# Patient Record
Sex: Male | Born: 1970 | Race: White | Hispanic: No | Marital: Married | Smoking: Current every day smoker
Health system: Southern US, Community
[De-identification: ages and names within clinical notes are randomized; demographics above are authoritative.]

---

## 1998-04-16 ENCOUNTER — Emergency Department (HOSPITAL_COMMUNITY): Admission: EM | Admit: 1998-04-16 | Discharge: 1998-04-16 | Payer: Self-pay | Admitting: Emergency Medicine

## 2002-04-05 ENCOUNTER — Emergency Department (HOSPITAL_COMMUNITY): Admission: EM | Admit: 2002-04-05 | Discharge: 2002-04-05 | Payer: Self-pay | Admitting: Emergency Medicine

## 2002-04-06 ENCOUNTER — Encounter: Payer: Self-pay | Admitting: Family Medicine

## 2002-04-06 ENCOUNTER — Ambulatory Visit (HOSPITAL_COMMUNITY): Admission: RE | Admit: 2002-04-06 | Discharge: 2002-04-06 | Payer: Self-pay | Admitting: Family Medicine

## 2008-03-21 ENCOUNTER — Emergency Department (HOSPITAL_COMMUNITY): Admission: EM | Admit: 2008-03-21 | Discharge: 2008-03-21 | Payer: Self-pay | Admitting: Emergency Medicine

## 2009-11-29 ENCOUNTER — Emergency Department (HOSPITAL_COMMUNITY): Admission: EM | Admit: 2009-11-29 | Discharge: 2009-11-29 | Payer: Self-pay | Admitting: Emergency Medicine

## 2010-04-02 ENCOUNTER — Emergency Department (HOSPITAL_COMMUNITY): Admission: EM | Admit: 2010-04-02 | Discharge: 2010-04-02 | Payer: Self-pay | Admitting: Emergency Medicine

## 2011-01-19 LAB — COMPREHENSIVE METABOLIC PANEL
ALT: 16 U/L (ref 0–53)
AST: 28 U/L (ref 0–37)
Albumin: 4.3 g/dL (ref 3.5–5.2)
Alkaline Phosphatase: 51 U/L (ref 39–117)
BUN: 9 mg/dL (ref 6–23)
CO2: 26 mEq/L (ref 19–32)
Calcium: 9.2 mg/dL (ref 8.4–10.5)
Chloride: 105 mEq/L (ref 96–112)
Creatinine, Ser: 1.08 mg/dL (ref 0.4–1.5)
GFR calc Af Amer: >60 mL/min (ref >60)
GFR calc non Af Amer: >60 mL/min (ref >60)
Glucose, Bld: 126 mg/dL — ABNORMAL HIGH (ref 70–99)
Potassium: 3.9 mEq/L (ref 3.5–5.1)
Sodium: 138 mEq/L (ref 135–145)
Total Bilirubin: 1.2 mg/dL (ref 0.3–1.2)
Total Protein: 7.1 g/dL (ref 6.0–8.3)

## 2011-01-19 LAB — DIFFERENTIAL
Basophils Absolute: 0 10*3/uL (ref 0.0–0.1)
Basophils Relative: 0 % (ref 0–1)
Eosinophils Absolute: 0 10*3/uL (ref 0.0–0.7)
Eosinophils Relative: 0 % (ref 0–5)
Lymphocytes Relative: 13 % (ref 12–46)
Lymphs Abs: 1.7 10*3/uL (ref 0.7–4.0)
Monocytes Absolute: 0.6 10*3/uL (ref 0.1–1.0)
Monocytes Relative: 5 % (ref 3–12)
Neutro Abs: 10.8 10*3/uL — ABNORMAL HIGH (ref 1.7–7.7)
Neutrophils Relative %: 82 % — ABNORMAL HIGH (ref 43–77)

## 2011-01-19 LAB — POCT CARDIAC MARKERS
CKMB, poc: 5.2 ng/mL (ref 1.0–8.0)
Myoglobin, poc: 213 ng/mL (ref 12–200)
Troponin i, poc: <0.05 ng/mL (ref 0.00–0.09)

## 2011-01-19 LAB — CBC
HCT: 46.6 % (ref 39.0–52.0)
Hemoglobin: 16 g/dL (ref 13.0–17.0)
MCHC: 34.3 g/dL (ref 30.0–36.0)
MCV: 94.6 fL (ref 78.0–100.0)
Platelets: 299 10*3/uL (ref 150–400)
RBC: 4.93 MIL/uL (ref 4.22–5.81)
RDW: 13.1 % (ref 11.5–15.5)
WBC: 13.2 10*3/uL — ABNORMAL HIGH (ref 4.0–10.5)

## 2011-01-19 LAB — LIPASE, BLOOD: Lipase: 24 U/L (ref 11–59)

## 2011-07-29 LAB — DIFFERENTIAL
Eosinophils Relative: 2
Lymphocytes Relative: 14
Monocytes Absolute: 0.2
Monocytes Relative: 3
Neutro Abs: 6.8

## 2011-07-29 LAB — CBC
HCT: 50.3
Hemoglobin: 17.5 — ABNORMAL HIGH
MCHC: 34.7
MCV: 92.2
Platelets: 298
RBC: 5.45
RDW: 13.6
WBC: 8.4

## 2011-07-29 LAB — COMPREHENSIVE METABOLIC PANEL
AST: 21
Albumin: 4.2
Alkaline Phosphatase: 59
BUN: 6
Chloride: 102
Creatinine, Ser: 1.05
GFR calc Af Amer: >60
Potassium: 4.5
Total Protein: 7.3

## 2011-07-29 LAB — LIPASE, BLOOD: Lipase: 20

## 2013-11-18 ENCOUNTER — Emergency Department (HOSPITAL_COMMUNITY)
Admission: EM | Admit: 2013-11-18 | Discharge: 2013-11-18 | Disposition: A | Discharge status: Home / Self Care | Payer: Self-pay | Attending: Emergency Medicine | Admitting: Emergency Medicine

## 2013-11-18 ENCOUNTER — Encounter (HOSPITAL_COMMUNITY): Payer: Self-pay | Admitting: Emergency Medicine

## 2013-11-18 DIAGNOSIS — T7411XA Adult physical abuse, confirmed, initial encounter: Secondary | ICD-10-CM | POA: Insufficient documentation

## 2013-11-18 DIAGNOSIS — F17200 Nicotine dependence, unspecified, uncomplicated: Secondary | ICD-10-CM | POA: Insufficient documentation

## 2013-11-18 DIAGNOSIS — S0101XA Laceration without foreign body of scalp, initial encounter: Secondary | ICD-10-CM

## 2013-11-18 DIAGNOSIS — Z23 Encounter for immunization: Secondary | ICD-10-CM | POA: Insufficient documentation

## 2013-11-18 DIAGNOSIS — S0100XA Unspecified open wound of scalp, initial encounter: Secondary | ICD-10-CM | POA: Insufficient documentation

## 2013-11-18 MED ORDER — TETANUS-DIPHTH-ACELL PERTUSSIS 5-2.5-18.5 LF-MCG/0.5 IM SUSP
0.5000 mL | Freq: Once | INTRAMUSCULAR | Status: AC
  Administered 2013-11-18: 0.5 mL via INTRAMUSCULAR
  Filled 2013-11-18: qty 0.5

## 2013-11-18 NOTE — Discharge Instructions (Signed)
Return for vomiting, passing out, confusion or any new concerns Keep wound clean, shower as normal.  Staples removed in 10-14 days.  If you were given medicines take as directed.  If you are on coumadin or contraceptives realize their levels and effectiveness is altered by many different medicines.  If you have any reaction (rash, tongues swelling, other) to the medicines stop taking and see a physician.   Please follow up as directed and return to the ER or see a physician for new or worsening symptoms.  Thank you.  Head Injury, Adult You have received a head injury. It does not appear serious at this time. Headaches and vomiting are common following head injury. It should be easy to awaken from sleeping. Sometimes it is necessary for you to stay in the emergency department for a while for observation. Sometimes admission to the hospital may be needed. After injuries such as yours, most problems occur within the first 24 hours, but side effects may occur up to 7 10 days after the injury. It is important for you to carefully monitor your condition and contact your health care provider or seek immediate medical care if there is a change in your condition. WHAT ARE THE TYPES OF HEAD INJURIES? Head injuries can be as minor as a bump. Some head injuries can be more severe. More severe head injuries include:  A jarring injury to the brain (concussion).  A bruise of the brain (contusion). This mean there is bleeding in the brain that can cause swelling.  A cracked skull (skull fracture).  Bleeding in the brain that collects, clots, and forms a bump (hematoma). WHAT CAUSES A HEAD INJURY? A serious head injury is most likely to happen to someone who is in a car wreck and is not wearing a seat belt. Other causes of major head injuries include bicycle or motorcycle accidents, sports injuries, and falls. HOW ARE HEAD INJURIES DIAGNOSED? A complete history of the event leading to the injury and your  current symptoms will be helpful in diagnosing head injuries. Many times, pictures of the brain, such as CT or MRI are needed to see the extent of the injury. Often, an overnight hospital stay is necessary for observation.  WHEN SHOULD I SEEK IMMEDIATE MEDICAL CARE?  You should get help right away if:  You have confusion or drowsiness.  You feel sick to your stomach (nauseous) or have continued, forceful vomiting.  You have dizziness or unsteadiness that is getting worse.  You have severe, continued headaches not relieved by medicine. Only take over-the-counter or prescription medicines for pain, fever, or discomfort as directed by your health care provider.  You do not have normal function of the arms or legs or are unable to walk.  You notice changes in the black spots in the center of the colored part of your eye (pupil).  You have a clear or bloody fluid coming from your nose or ears.  You have a loss of vision. During the next 24 hours after the injury, you must stay with someone who can watch you for the warning signs. This person should contact local emergency services (911 in the [place] if you have seizures, you become unconscious, or you are unable to wake up. HOW CAN I PREVENT A HEAD INJURY IN THE FUTURE? The most important factor for preventing major head injuries is avoiding motor vehicle accidents. To minimize the potential for damage to your head, it is crucial to wear seat belts while riding in  motor vehicles. Wearing helmets while bike riding and playing collision sports (like football) is also helpful. Also, avoiding dangerous activities around the house will further help reduce your risk of head injury.  WHEN CAN I RETURN TO NORMAL ACTIVITIES AND ATHLETICS? You should be reevaluated by your health care provider before returning to these activities. If you have any of the following symptoms, you should not return to activities or contact sports until 1 week after the symptoms  have stopped:  Persistent headache.  Dizziness or vertigo.  Poor attention and concentration.  Confusion.  Memory problems.  Nausea or vomiting.  Fatigue or tire easily.  Irritability.  Intolerant of bright lights or loud noises.  Anxiety or depression.  Disturbed sleep. MAKE SURE YOU:   Understand these instructions.  Will watch your condition.  Will get help right away if you are not doing well or get worse. Document Released: 10/19/2005 Document Revised: 08/09/2013 Document Reviewed: 06/26/2013 [facility] Patient Information 2014 [place], [place].
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
## 2014-05-03 ENCOUNTER — Emergency Department (HOSPITAL_COMMUNITY)
Admission: EM | Admit: 2014-05-03 | Discharge: 2014-05-03 | Disposition: A | Discharge status: Home / Self Care | Payer: Worker's Compensation | Attending: Emergency Medicine | Admitting: Emergency Medicine

## 2014-05-03 ENCOUNTER — Encounter (HOSPITAL_COMMUNITY): Payer: Self-pay | Admitting: Emergency Medicine

## 2014-05-03 DIAGNOSIS — S99919A Unspecified injury of unspecified ankle, initial encounter: Principal | ICD-10-CM

## 2014-05-03 DIAGNOSIS — Y9289 Other specified places as the place of occurrence of the external cause: Secondary | ICD-10-CM | POA: Diagnosis not present

## 2014-05-03 DIAGNOSIS — F17200 Nicotine dependence, unspecified, uncomplicated: Secondary | ICD-10-CM | POA: Insufficient documentation

## 2014-05-03 DIAGNOSIS — S99929A Unspecified injury of unspecified foot, initial encounter: Principal | ICD-10-CM

## 2014-05-03 DIAGNOSIS — S8990XA Unspecified injury of unspecified lower leg, initial encounter: Secondary | ICD-10-CM | POA: Insufficient documentation

## 2014-05-03 DIAGNOSIS — X500XXA Overexertion from strenuous movement or load, initial encounter: Secondary | ICD-10-CM | POA: Diagnosis not present

## 2014-05-03 DIAGNOSIS — M25561 Pain in right knee: Secondary | ICD-10-CM

## 2014-05-03 DIAGNOSIS — Y990 Civilian activity done for income or pay: Secondary | ICD-10-CM | POA: Diagnosis not present

## 2014-05-03 MED ORDER — NAPROXEN 500 MG PO TABS: 500.0000 mg | ORAL_TABLET | Freq: Two times a day (BID) | ORAL | Status: DC

## 2014-05-03 MED ORDER — HYDROCODONE-ACETAMINOPHEN 5-325 MG PO TABS: 1.0000 | ORAL_TABLET | Freq: Four times a day (QID) | ORAL | Status: DC | PRN

## 2014-05-03 NOTE — Progress Notes (Signed)
[facility] provided pt with a list of primary care resources to help patient establish a pcp.  °

## 2014-05-03 NOTE — Progress Notes (Signed)
Orthopedic Tech Progress Note Patient Details:  Gilda CreaseHarold L Brissett 10/17/1971 [id number] Applied knee immobilizer to RLE. Ortho Devices Type of Ortho Device: Knee Immobilizer Ortho Device/Splint Location: RLE Ortho Device/Splint Interventions: Application   Lesle ChrisGilliland, Nelsy Madonna L 05/03/2014, 12:57 PM

## 2014-05-03 NOTE — ED Provider Notes (Signed)
Medical screening examination/treatment/procedure(s) were performed by non-physician practitioner and as supervising physician I was immediately available for consultation/collaboration.   EKG Interpretation None        Gwyneth SproutWhitney Donna Silverman, MD 05/03/14 1102

## 2014-05-03 NOTE — ED Provider Notes (Signed)
CSN: [id number]     Arrival date & time 05/03/14  1002 History   First MD Initiated Contact with Patient 05/03/14 1006     No chief complaint on file.    (Consider location/radiation/quality/duration/timing/severity/associated sxs/prior Treatment) HPI Comments: Patient presents today with pain of his right knee.  He reports that the pain has been present since yesterday.  Pain worse along the medial aspect of the knee.  He states that yesterday while at work he was on the ground working on a car.  When he stood up he felt and heard a pop in the right knee.  He reports that he has been having constant pain and swelling of the knee since that time.  Pain worse with ambulating.  He reports that last evening he took Ibuprofen and applied ice without relief.  He denies any prior injury to the knee.  He denies erythema or warmth of the knee.  No fever or chills.  No numbness or tingling.    The history is provided by the patient.    No past medical history on file. No past surgical history on file. No family history on file. History  Substance Use Topics  . Smoking status: Current Every Day Smoker  . Smokeless tobacco: Never Used  . Alcohol Use: Yes     Comment: occassionally    Review of Systems  Constitutional: Negative for fever and chills.  Musculoskeletal:       Right knee pain and swelling  Skin: Negative for color change.  Neurological: Negative for numbness.      Allergies  Review of patient's allergies indicates no known allergies.  Home Medications   Prior to Admission medications   Not on File   There were no vitals taken for this visit. Physical Exam  Nursing note and vitals reviewed. Constitutional: He appears well-developed and well-nourished.  HENT:  Head: Normocephalic and atraumatic.  Mouth/Throat: Oropharynx is clear and moist.  Neck: Normal range of motion. Neck supple.  Cardiovascular: Normal rate, regular rhythm and normal heart sounds.   Pulses:  Dorsalis pedis pulses are 2+ on the right side, and 2+ on the left side.  Pulmonary/Chest: Effort normal and breath sounds normal.  Musculoskeletal: Normal range of motion.       Right knee: He exhibits no deformity, no erythema, no LCL laxity and no MCL laxity. Tenderness found. Medial joint line tenderness noted.  Mild swelling of the medial aspect of the right knee No obvious laxity with Anterior and Posterior drawer Increased pain with flexion of the knee and valgus stress of the knee    Neurological: He is alert.  Distal sensation of the right foot intact  Skin: Skin is warm and dry.  No erythema or warmth of the right knee No bruising of the right knee  Psychiatric: He has a normal mood and affect.    ED Course  Procedures (including critical care time) Labs Review Labs Reviewed - No data to display  Imaging Review No results found.   EKG Interpretation None      MDM   Final diagnoses:  None   Patient presenting with right knee pain.  He reports feeling and hearing a "pop" in the knee when standing up yesterday at work. No signs of infection on exam.  Due to the mechanism of injury do not feel that an xray is indicated.  No laxity of the ligaments.  Patient given knee immobilizer, pain medication, Naproxen.  RICE instructions also given.  Patient  given referral to Orthopedics.  Patient stable for discharge.  Return precautions given.      Santiago GladHeather Harlyn Rathmann, PA-C 05/03/14 1053

## 2014-05-03 NOTE — Discharge Instructions (Signed)
Be sure to read and understand instructions below prior to leaving the hospital. If your symptoms persist without any improvement in 1 week it is reccommended that you follow up with orthopedics listed above. Use your pain medication as prescribed and do not operate heavy machinery while on pain medication. Note that your pain medication contains acetaminophen (Tylenol) & its is not recommended that you use additional acetaminophen (Tylenol) while taking this medication. ° °Knee Effusion  °The medical term for having fluid in your knee is effusion.This means something is wrong inside the knee. Some of the causes of fluid in the knee may be torn cartilage, a torn ligament, or bleeding into the joint from an injury. Small tears may heal on their own with conservative treatment. Conservative means rest, limited weight bearing activity and muscle strengthening exercises. Your recovery may take up to 6 weeks. Larger tears may require surgery.  ° °TREATMENT  °Rest, ice, elevation, and compression are the basic modes of treatment.   °Apply ice to the sore area for 15 to 20 minutes, 3 to 4 times per day. Do this while you are awake for the first 2 days, or as directed. This can be stopped when the swelling goes away. Put the ice in a plastic bag and place a towel between the bag of ice and your skin.  °Keep your leg elevated when possible to lessen swelling.  °If your caregiver recommends crutches, use them as instructed for 1 week. Then, you may walk as tolerated.  °Do not drive a vehicle on pain medication. °ACTIVITY: °           - Weight bearing as tolerated °           - Exercises should be limited to pain free range of motion ° °Knee Immobilization:: This is used to support and protect an injured or painful knee. Knee immobilizers keep your knee from being used while it is healing.  °Use powder to control irritation from sweat and friction.  °Adjust the immobilizer to be firm but not tight. Signs of an immobilizer that  is too tight include:  ° Swelling.  ° Numbness.  ° Color change in your foot or ankle.  ° Increased pain.  °While resting, raise your leg above the level of your heart. This reduces throbbing and helps healing. Prop it up with pillows.  °Remove the immobilizer to bathe and sleep. Wear it other times until you see your doctor again.  °             °SEEK MEDICAL CARE IF:  °You have an increase in bruising, swelling, or pain.  °Your toes feel cold.  °Pain relief is not achieved with medications.  °EMERGENCY:: Your toes are numb or blue or you have severe pain.  °You notice redness, swelling, warmth or increasing pain in your knee.  °An unexplained oral temperature above 102° F (38.9° C) develops. ° °COLD THERAPY DIRECTIONS:  °Ice or gel packs can be used to reduce both pain and swelling. Ice is the most helpful within the first 24 to 48 hours after an injury or flareup from overusing a muscle or joint.  Ice is effective, has very few side effects, and is safe for most people to use.  ° °If you expose your skin to cold temperatures for too long or without the proper protection, you can damage your skin or nerves. Watch for signs of skin damage due to cold.  ° °HOME CARE INSTRUCTIONS  °Follow these   tips to use ice and cold packs safely.  °Place a dry or damp towel between the ice and skin. A damp towel will cool the skin more quickly, so you may need to shorten the time that the ice is used.  °For a more rapid response, add gentle compression to the ice.  °Ice for no more than 10 to 20 minutes at a time. The bonier the area you are icing, the less time it will take to get the benefits of ice.  °Check your skin after 5 minutes to make sure there are no signs of a poor response to cold or skin damage.  °Rest 20 minutes or more in between uses.  °Once your skin is numb, you can end your treatment. You can test numbness by very lightly touching your skin. The touch should be so light that you do not see the skin dimple from  the pressure of your fingertip. When using ice, most people will feel these normal sensations in this order: cold, burning, aching, and numbness.  °Do not use ice on someone who cannot communicate their responses to pain, such as small children or people with dementia.  ° °HOW TO MAKE AN ICE PACK  °To make an ice pack, do one of the following:  °Place crushed ice or a bag of frozen vegetables in a sealable plastic bag. Squeeze out the excess air. Place this bag inside another plastic bag. Slide the bag into a pillowcase or place a damp towel between your skin and the bag.  °Mix 3 parts water with 1 part rubbing alcohol. Freeze the mixture in a sealable plastic bag. When you remove the mixture from the freezer, it will be slushy. Squeeze out the excess air. Place this bag inside another plastic bag. Slide the bag into a pillowcase or place a damp towel between your s ° ° ° ° ° °

## 2014-05-03 NOTE — ED Notes (Signed)
C/o r/knee pain and swelling. Pt stated that he stood quickly while at work yesterday and fell a popping sensation in r/knee. C/o r/knee swelling and pain, treated with ice pack and motrin

## 2017-11-09 ENCOUNTER — Emergency Department (HOSPITAL_COMMUNITY): Payer: No Typology Code available for payment source

## 2017-11-09 ENCOUNTER — Emergency Department (HOSPITAL_COMMUNITY)
Admission: EM | Admit: 2017-11-09 | Discharge: 2017-11-09 | Disposition: A | Discharge status: Home / Self Care | Payer: No Typology Code available for payment source | Attending: Emergency Medicine | Admitting: Emergency Medicine

## 2017-11-09 DIAGNOSIS — Z79899 Other long term (current) drug therapy: Secondary | ICD-10-CM | POA: Insufficient documentation

## 2017-11-09 DIAGNOSIS — M545 Low back pain: Secondary | ICD-10-CM | POA: Diagnosis not present

## 2017-11-09 DIAGNOSIS — Y92410 Unspecified street and highway as the place of occurrence of the external cause: Secondary | ICD-10-CM | POA: Diagnosis not present

## 2017-11-09 DIAGNOSIS — Y9389 Activity, other specified: Secondary | ICD-10-CM | POA: Diagnosis not present

## 2017-11-09 DIAGNOSIS — S50312A Abrasion of left elbow, initial encounter: Secondary | ICD-10-CM | POA: Insufficient documentation

## 2017-11-09 DIAGNOSIS — F17200 Nicotine dependence, unspecified, uncomplicated: Secondary | ICD-10-CM | POA: Diagnosis not present

## 2017-11-09 DIAGNOSIS — Y998 Other external cause status: Secondary | ICD-10-CM | POA: Diagnosis not present

## 2017-11-09 DIAGNOSIS — T148XXA Other injury of unspecified body region, initial encounter: Secondary | ICD-10-CM

## 2017-11-09 MED ORDER — NAPROXEN 500 MG PO TABS: 500.0000 mg | ORAL_TABLET | Freq: Two times a day (BID) | ORAL | 0 refills | Status: DC

## 2017-11-09 MED ORDER — TETANUS-DIPHTH-ACELL PERTUSSIS 5-2.5-18.5 LF-MCG/0.5 IM SUSP
0.5000 mL | Freq: Once | INTRAMUSCULAR | Status: DC
  Filled 2017-11-09: qty 0.5

## 2017-11-09 MED ORDER — METHOCARBAMOL 500 MG PO TABS: 500.0000 mg | ORAL_TABLET | Freq: Two times a day (BID) | ORAL | 0 refills | Status: DC

## 2017-11-09 MED ORDER — METHOCARBAMOL 500 MG PO TABS
750.0000 mg | ORAL_TABLET | Freq: Once | ORAL | Status: AC
  Administered 2017-11-09: 750 mg via ORAL
  Filled 2017-11-09: qty 2

## 2017-11-09 MED ORDER — IBUPROFEN 200 MG PO TABS
600.0000 mg | ORAL_TABLET | Freq: Once | ORAL | Status: AC
  Administered 2017-11-09: 600 mg via ORAL
  Filled 2017-11-09: qty 1
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
## 2017-11-09 NOTE — ED Notes (Signed)
Patient returned from x ray and placed in patient bed for comfort
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
## 2017-11-12 ENCOUNTER — Encounter (INDEPENDENT_AMBULATORY_CARE_PROVIDER_SITE_OTHER): Payer: Self-pay | Admitting: Family

## 2017-11-12 ENCOUNTER — Ambulatory Visit (INDEPENDENT_AMBULATORY_CARE_PROVIDER_SITE_OTHER): Payer: Self-pay

## 2017-11-12 ENCOUNTER — Ambulatory Visit (INDEPENDENT_AMBULATORY_CARE_PROVIDER_SITE_OTHER): Payer: Self-pay | Admitting: Family

## 2017-11-12 VITALS — Ht 69.0 in | Wt 140.0 lb

## 2017-11-12 DIAGNOSIS — M542 Cervicalgia: Secondary | ICD-10-CM

## 2017-11-12 DIAGNOSIS — R200 Anesthesia of skin: Secondary | ICD-10-CM

## 2017-11-19 ENCOUNTER — Encounter (INDEPENDENT_AMBULATORY_CARE_PROVIDER_SITE_OTHER): Payer: Self-pay | Admitting: Family
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
## 2017-11-26 ENCOUNTER — Ambulatory Visit (INDEPENDENT_AMBULATORY_CARE_PROVIDER_SITE_OTHER): Payer: Self-pay | Admitting: Family

## 2017-11-26 ENCOUNTER — Encounter (INDEPENDENT_AMBULATORY_CARE_PROVIDER_SITE_OTHER): Payer: Self-pay | Admitting: Family

## 2017-11-26 DIAGNOSIS — R200 Anesthesia of skin: Secondary | ICD-10-CM

## 2017-11-26 MED ORDER — PREDNISONE 10 MG PO TABS: ORAL_TABLET | ORAL | 0 refills | Status: DC

## 2017-11-26 NOTE — Addendum Note (Signed)
Addended by: Barnie DelZAMORA, Cassidey Barrales R on: 11/26/2017 12:50 PM   Modules accepted: Orders
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
## 2017-11-29 ENCOUNTER — Telehealth (INDEPENDENT_AMBULATORY_CARE_PROVIDER_SITE_OTHER): Payer: Self-pay | Admitting: Family

## 2017-11-29 ENCOUNTER — Other Ambulatory Visit (INDEPENDENT_AMBULATORY_CARE_PROVIDER_SITE_OTHER): Payer: Self-pay

## 2017-11-29 NOTE — Telephone Encounter (Signed)
Note written for activity as tolerated by the pt and faxed as requsted

## 2017-11-29 NOTE — Telephone Encounter (Signed)
Patient states they were released back to work this week but his employer needs a note, please fax to # [phone].

## 2018-01-04 ENCOUNTER — Encounter (INDEPENDENT_AMBULATORY_CARE_PROVIDER_SITE_OTHER): Payer: Self-pay | Admitting: Physical Medicine and Rehabilitation

## 2018-01-04 ENCOUNTER — Ambulatory Visit (INDEPENDENT_AMBULATORY_CARE_PROVIDER_SITE_OTHER): Payer: Self-pay | Admitting: Physical Medicine and Rehabilitation

## 2018-01-04 DIAGNOSIS — R202 Paresthesia of skin: Secondary | ICD-10-CM

## 2018-01-04 NOTE — Progress Notes (Signed)
.  Numeric Pain Rating Scale and Functional Assessment Average Pain 8 Pain Right Now 7 My pain is constant and tingling Pain is worse with: some activites Pain improves with: Nothing   In the last MONTH (on 0-10 scale) has pain interfered with the following?  1. General activity like being  able to carry out your everyday physical activities such as walking, climbing stairs, carrying groceries, or moving a chair?  Rating(8)  2. Relation with others like being able to carry out your usual social activities and roles such as  activities at home, at work and in your community. Rating(0)  3. Enjoyment of life such that you have  been bothered by emotional problems such as feeling anxious, depressed or irritable?  Rating(6)
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
## 2018-01-11 ENCOUNTER — Encounter (INDEPENDENT_AMBULATORY_CARE_PROVIDER_SITE_OTHER): Payer: Self-pay | Admitting: Orthopedic Surgery

## 2018-01-11 ENCOUNTER — Ambulatory Visit (INDEPENDENT_AMBULATORY_CARE_PROVIDER_SITE_OTHER): Payer: Self-pay | Admitting: Orthopedic Surgery

## 2018-01-11 VITALS — Ht 69.0 in | Wt 140.0 lb

## 2018-01-11 DIAGNOSIS — G5622 Lesion of ulnar nerve, left upper limb: Secondary | ICD-10-CM
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
## 2018-10-12 ENCOUNTER — Telehealth (INDEPENDENT_AMBULATORY_CARE_PROVIDER_SITE_OTHER): Payer: Self-pay | Admitting: Orthopedic Surgery

## 2018-10-12 NOTE — Telephone Encounter (Signed)
I received opinion letter from [employer] dated 10/04/18. Mailed prepayment letter, will hold letter until payment received.

## 2019-08-25 ENCOUNTER — Encounter (HOSPITAL_COMMUNITY): Payer: Self-pay | Admitting: Emergency Medicine

## 2019-08-25 ENCOUNTER — Emergency Department (HOSPITAL_COMMUNITY)
Admission: EM | Admit: 2019-08-25 | Discharge: 2019-08-26 | Disposition: A | Discharge status: Home / Self Care | Payer: Self-pay | Attending: Emergency Medicine | Admitting: Emergency Medicine

## 2019-08-25 ENCOUNTER — Other Ambulatory Visit: Payer: Self-pay

## 2019-08-25 DIAGNOSIS — M549 Dorsalgia, unspecified: Secondary | ICD-10-CM | POA: Insufficient documentation

## 2019-08-25 DIAGNOSIS — Z5321 Procedure and treatment not carried out due to patient leaving prior to being seen by health care provider: Secondary | ICD-10-CM | POA: Insufficient documentation
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
## 2019-08-27 ENCOUNTER — Emergency Department (HOSPITAL_COMMUNITY): Payer: Self-pay

## 2019-08-27 ENCOUNTER — Encounter (HOSPITAL_COMMUNITY): Payer: Self-pay | Admitting: Emergency Medicine

## 2019-08-27 ENCOUNTER — Other Ambulatory Visit: Payer: Self-pay

## 2019-08-27 ENCOUNTER — Emergency Department (HOSPITAL_COMMUNITY)
Admission: EM | Admit: 2019-08-27 | Discharge: 2019-08-27 | Disposition: A | Discharge status: Home / Self Care | Payer: Self-pay | Attending: Emergency Medicine | Admitting: Emergency Medicine

## 2019-08-27 DIAGNOSIS — S60511A Abrasion of right hand, initial encounter: Secondary | ICD-10-CM | POA: Diagnosis not present

## 2019-08-27 DIAGNOSIS — T07XXXA Unspecified multiple injuries, initial encounter: Secondary | ICD-10-CM

## 2019-08-27 DIAGNOSIS — F17200 Nicotine dependence, unspecified, uncomplicated: Secondary | ICD-10-CM | POA: Insufficient documentation

## 2019-08-27 DIAGNOSIS — S5001XA Contusion of right elbow, initial encounter: Secondary | ICD-10-CM | POA: Insufficient documentation

## 2019-08-27 DIAGNOSIS — Y92411 Interstate highway as the place of occurrence of the external cause: Secondary | ICD-10-CM | POA: Diagnosis not present

## 2019-08-27 DIAGNOSIS — Y9389 Activity, other specified: Secondary | ICD-10-CM | POA: Diagnosis not present

## 2019-08-27 DIAGNOSIS — Y999 Unspecified external cause status: Secondary | ICD-10-CM | POA: Diagnosis not present

## 2019-08-27 DIAGNOSIS — S60419A Abrasion of unspecified finger, initial encounter: Secondary | ICD-10-CM

## 2019-08-27 DIAGNOSIS — S40011A Contusion of right shoulder, initial encounter: Secondary | ICD-10-CM | POA: Insufficient documentation

## 2019-08-27 DIAGNOSIS — S4991XA Unspecified injury of right shoulder and upper arm, initial encounter: Secondary | ICD-10-CM | POA: Diagnosis present

## 2019-08-27 DIAGNOSIS — Z23 Encounter for immunization: Secondary | ICD-10-CM | POA: Insufficient documentation

## 2019-08-27 MED ORDER — BACITRACIN ZINC 500 UNIT/GM EX OINT: 1.0000 "application " | TOPICAL_OINTMENT | Freq: Two times a day (BID) | CUTANEOUS | 0 refills | Status: DC

## 2019-08-27 MED ORDER — BACITRACIN ZINC 500 UNIT/GM EX OINT
1.0000 "application " | TOPICAL_OINTMENT | Freq: Two times a day (BID) | CUTANEOUS | Status: DC
  Administered 2019-08-27: 1 via TOPICAL
  Filled 2019-08-27: qty 5.4

## 2019-08-27 MED ORDER — HYDROCODONE-ACETAMINOPHEN 5-325 MG PO TABS
2.0000 | ORAL_TABLET | Freq: Once | ORAL | Status: AC
  Administered 2019-08-27: 14:00:00 2 via ORAL
  Filled 2019-08-27: qty 2

## 2019-08-27 MED ORDER — MELOXICAM 7.5 MG PO TABS: 7.5000 mg | ORAL_TABLET | Freq: Two times a day (BID) | ORAL | 0 refills | Status: AC | PRN

## 2019-08-27 MED ORDER — TETANUS-DIPHTH-ACELL PERTUSSIS 5-2.5-18.5 LF-MCG/0.5 IM SUSP
0.5000 mL | Freq: Once | INTRAMUSCULAR | Status: AC
Start: 2019-08-27 — End: 2019-08-27
  Administered 2019-08-27: 14:00:00 0.5 mL via INTRAMUSCULAR
  Filled 2019-08-27: qty 0.5

## 2019-08-27 NOTE — Discharge Instructions (Addendum)
Please see your family doctor for follow-up of these wounds in the next week, this will take some time to heal, make sure that you keep these wounds covered with an antibiotic ointment, cleaning them with warm soapy water twice a day.  Return to the emergency department for severe or worsening pain, swelling, fever, spreading redness or foul-smelling drainage from the wounds.  The sling is for comfort  Please take Mobic - 7.5mg  by mouth twice daily as needed for pain - this in an antiinflammatory medicine (NSAID) and is similar to ibuprofen - many people feel that it is stronger than ibuprofen and it is easier to take since it is a smaller pill.  Please use this only for 1 week - if your pain persists, you will need to follow up with your doctor in the office for ongoing guidance and pain control.
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
## 2021-02-09 ENCOUNTER — Other Ambulatory Visit: Payer: Self-pay

## 2021-02-09 ENCOUNTER — Emergency Department (HOSPITAL_COMMUNITY)
Admission: EM | Admit: 2021-02-09 | Discharge: 2021-02-09 | Disposition: A | Discharge status: Home / Self Care | Payer: Self-pay | Attending: Physician Assistant | Admitting: Physician Assistant

## 2021-02-09 ENCOUNTER — Encounter (HOSPITAL_COMMUNITY): Payer: Self-pay | Admitting: Emergency Medicine

## 2021-02-09 DIAGNOSIS — R1013 Epigastric pain: Secondary | ICD-10-CM | POA: Insufficient documentation

## 2021-02-09 DIAGNOSIS — Z5321 Procedure and treatment not carried out due to patient leaving prior to being seen by health care provider: Secondary | ICD-10-CM | POA: Insufficient documentation

## 2021-02-09 MED ORDER — LIDOCAINE VISCOUS HCL 2 % MT SOLN
15.0000 mL | Freq: Once | OROMUCOSAL | Status: DC
  Filled 2021-02-09: qty 15

## 2021-02-09 MED ORDER — ALUM & MAG HYDROXIDE-SIMETH 200-200-20 MG/5ML PO SUSP
30.0000 mL | Freq: Once | ORAL | Status: DC
  Filled 2021-02-09: qty 30

## 2021-02-09 NOTE — ED Triage Notes (Signed)
Pt c/o epigastric pain x2 wks.

## 2021-02-09 NOTE — ED Provider Notes (Cosign Needed)
MSE was initiated and I personally evaluated the patient and placed orders (if any) at  6:10 PM on February 09, 2021.  Patient is a 50 year old male with a history of epigastric pain who presents the emergency department due to epigastric pain.  He states that he was initially evaluated for this and was prescribed "3 medications".  Per records, it appears that he was prescribed famotidine, tramadol, as well as Reglan.  He states that this provided significant relief of his pain.  He then ran out of these medications and was not given any follow-up.  He states that over the past few days his pain is returned.  It is intermittent.  Typically worse with eating.  He states that it resolves mildly when drinking milk and worsens when eating spicy foods.  Reports an episode of vomiting yesterday when his pain worsened.  No current nausea or vomiting.  No diarrhea.  No fevers or chills.  We will order basic labs.  GI cocktail.  The patient appears stable so that the remainder of the MSE may be completed by another provider.   Placido Sou, PA-C 02/09/21 1812

## 2021-06-19 ENCOUNTER — Encounter (HOSPITAL_COMMUNITY): Payer: Self-pay | Admitting: Emergency Medicine

## 2021-06-19 ENCOUNTER — Other Ambulatory Visit: Payer: Self-pay

## 2021-06-19 ENCOUNTER — Emergency Department (HOSPITAL_COMMUNITY)
Admission: EM | Admit: 2021-06-19 | Discharge: 2021-06-19 | Disposition: A | Discharge status: Home / Self Care | Payer: Self-pay | Attending: Emergency Medicine | Admitting: Emergency Medicine

## 2021-06-19 ENCOUNTER — Emergency Department (HOSPITAL_COMMUNITY): Payer: Self-pay

## 2021-06-19 DIAGNOSIS — Z20822 Contact with and (suspected) exposure to covid-19: Secondary | ICD-10-CM | POA: Insufficient documentation

## 2021-06-19 DIAGNOSIS — R0602 Shortness of breath: Secondary | ICD-10-CM | POA: Insufficient documentation

## 2021-06-19 DIAGNOSIS — R112 Nausea with vomiting, unspecified: Secondary | ICD-10-CM | POA: Insufficient documentation

## 2021-06-19 DIAGNOSIS — Z5321 Procedure and treatment not carried out due to patient leaving prior to being seen by health care provider: Secondary | ICD-10-CM | POA: Insufficient documentation

## 2021-06-19 LAB — CBC WITH DIFFERENTIAL/PLATELET
Abs Immature Granulocytes: 0.02 10*3/uL (ref 0.00–0.07)
Basophils Absolute: 0.1 10*3/uL (ref 0.0–0.1)
Basophils Relative: 1 %
Eosinophils Absolute: 1 10*3/uL — ABNORMAL HIGH (ref 0.0–0.5)
Eosinophils Relative: 11 %
HCT: 50.1 % (ref 39.0–52.0)
Hemoglobin: 16.9 g/dL (ref 13.0–17.0)
Immature Granulocytes: 0 %
Lymphocytes Relative: 36 %
Lymphs Abs: 3.2 10*3/uL (ref 0.7–4.0)
MCH: 31.1 pg (ref 26.0–34.0)
MCHC: 33.7 g/dL (ref 30.0–36.0)
MCV: 92.1 fL (ref 80.0–100.0)
Monocytes Absolute: 0.5 10*3/uL (ref 0.1–1.0)
Monocytes Relative: 5 %
Neutro Abs: 4.1 10*3/uL (ref 1.7–7.7)
Neutrophils Relative %: 47 %
Platelets: 366 10*3/uL (ref 150–400)
RBC: 5.44 MIL/uL (ref 4.22–5.81)
RDW: 14 % (ref 11.5–15.5)
WBC: 8.9 10*3/uL (ref 4.0–10.5)
nRBC: 0 % (ref 0.0–0.2)

## 2021-06-19 LAB — COMPREHENSIVE METABOLIC PANEL
ALT: 19 U/L (ref 0–44)
AST: 29 U/L (ref 15–41)
Albumin: 4.1 g/dL (ref 3.5–5.0)
Alkaline Phosphatase: 60 U/L (ref 38–126)
Anion gap: 12 (ref 5–15)
BUN: 6 mg/dL (ref 6–20)
CO2: 24 mmol/L (ref 22–32)
Calcium: 9.4 mg/dL (ref 8.9–10.3)
Chloride: 101 mmol/L (ref 98–111)
Creatinine, Ser: 1 mg/dL (ref 0.61–1.24)
GFR, Estimated: >60 mL/min (ref >60)
Glucose, Bld: 91 mg/dL (ref 70–99)
Potassium: 3.9 mmol/L (ref 3.5–5.1)
Sodium: 137 mmol/L (ref 135–145)
Total Bilirubin: 0.2 mg/dL — ABNORMAL LOW (ref 0.3–1.2)
Total Protein: 7.2 g/dL (ref 6.5–8.1)

## 2021-06-19 LAB — RESP PANEL BY RT-PCR (FLU A&B, COVID) ARPGX2
Influenza A by PCR: NEGATIVE
Influenza B by PCR: NEGATIVE
SARS Coronavirus 2 by RT PCR: NEGATIVE

## 2021-06-19 LAB — LIPASE, BLOOD: Lipase: 39 U/L (ref 11–51)

## 2021-06-19 NOTE — ED Notes (Signed)
PT LWBS °

## 2021-06-19 NOTE — ED Provider Notes (Signed)
Emergency Medicine Provider Triage Evaluation Note  Isaac Steele , a 50 y.o. male  was evaluated in triage.  Pt complains of vomiting and shortness of breath that began tonight about 30 minutes after he had steak for dinner. Patient reports he was laying down to sleep and then suddenly felt nausea and the need to vomit, was not able to stop for several minutes. After the vomiting calmed down, the patient reports he is short of breath. He smokes 1ppd. He reports a history of pancreatitis around 4 months ago.  Review of Systems  Positive: Nausea, vomiting, shortness of breath Negative: Fever, chest pain, hematemesis  Physical Exam  BP 115/87 (BP Location: Left Arm)   Pulse 90   Temp 98 F (36.7 C) (Oral)   Resp (!) 24   SpO2 97%  Gen:   Awake, no distress  Resp:  Normal effort with some tachypnea on room air MSK:   Moves extremities without difficulty Other:  Epigastric tenderness to palpation, BS normal in all 4 quadrants  Medical Decision Making  Medically screening exam initiated at 1:45 AM.  Appropriate orders placed.  Isaac Steele was informed that the remainder of the evaluation will be completed by another provider, this initial triage assessment does not replace that evaluation, and the importance of remaining in the ED until their evaluation is complete.  Vomiting, shortness of breath, abdominal pain   Isaac Steele 06/19/21 0149    Isaac Octave, MD 06/19/21 [phone]

## 2021-06-19 NOTE — ED Triage Notes (Signed)
Per EMS, pt began throwing up about 30 minutes after he ate steak tonight.  No chest/abdominal pain.  Minor SOB.    86 CBG 136/90 98%RA 80 HR 18G L AC

## 2021-08-26 ENCOUNTER — Encounter (HOSPITAL_BASED_OUTPATIENT_CLINIC_OR_DEPARTMENT_OTHER): Payer: Self-pay

## 2021-08-26 ENCOUNTER — Other Ambulatory Visit: Payer: Self-pay

## 2021-08-26 ENCOUNTER — Emergency Department (HOSPITAL_BASED_OUTPATIENT_CLINIC_OR_DEPARTMENT_OTHER)
Admission: EM | Admit: 2021-08-26 | Discharge: 2021-08-26 | Disposition: A | Discharge status: Home / Self Care | Payer: Self-pay | Attending: Emergency Medicine | Admitting: Emergency Medicine

## 2021-08-26 DIAGNOSIS — K047 Periapical abscess without sinus: Secondary | ICD-10-CM | POA: Insufficient documentation

## 2021-08-26 DIAGNOSIS — F17210 Nicotine dependence, cigarettes, uncomplicated: Secondary | ICD-10-CM | POA: Insufficient documentation

## 2021-08-26 MED ORDER — AMOXICILLIN-POT CLAVULANATE 875-125 MG PO TABS: 1.0000 | ORAL_TABLET | Freq: Two times a day (BID) | ORAL | 0 refills | Status: DC

## 2021-08-26 NOTE — ED Provider Notes (Signed)
[facility] EMERGENCY DEPT Provider Note   CSN: [id number] Arrival date & time: 08/26/21  0935     History Chief Complaint  Patient presents with   Dental Pain    Isaac Steele is a 50 y.o. male.   Dental Pain Associated symptoms: facial swelling   Associated symptoms: no congestion, no drooling, no fever, no headaches and no neck pain   Patient presents for dental pain, and gingival swelling.  Area of concern was his upper left incisor.  He had onset of swelling and pain 4 days ago.  Swelling increased to involve the area of his left cheek.  He denies any fevers or chills.  He has a dentist appointment scheduled for tomorrow.  Because of the pain and swelling, he was going to go to the dental clinic for a walk-in appointment.  He was told that they were very busy and was advised to come to the ED.  While he was awaiting being bedded in the ED, he did experience a foul taste in his mouth.  He has been spitting out purulent saliva.  With this, he feels that the swelling has diminished.  Patient denies any known allergies to antibiotics.    No past medical history on file.  There are no problems to display for this patient.   No past surgical history on file.     No family history on file.  Social History   Tobacco Use   Smoking status: Every Day    Packs/day: 1.00    Types: Cigarettes    Passive exposure: Current   Smokeless tobacco: Never  Vaping Use   Vaping Use: Never used  Substance Use Topics   Alcohol use: Yes    Alcohol/week: 12.0 standard drinks    Types: 12 Cans of beer per week    Comment: occassionally   Drug use: No    Home Medications Prior to Admission medications   Medication Sig Start Date End Date Taking? Authorizing Provider  amoxicillin-clavulanate (AUGMENTIN) 875-125 MG tablet Take 1 tablet by mouth every 12 (twelve) hours. 08/26/21  Yes Gloris Manchester, MD  Aspirin-Acetaminophen-Caffeine (GOODYS EXTRA STRENGTH) [phone] MG PACK  Take 1 Package by mouth as needed.   Yes [provider]  famotidine (PEPCID) 20 MG tablet Take 20 mg by mouth daily. 06/22/20  Yes [provider]  pantoprazole (PROTONIX) 40 MG tablet Take 40 mg by mouth 2 (two) times daily. 03/05/21  Yes [provider]  ibuprofen (ADVIL,MOTRIN) 200 MG tablet Take 200 mg by mouth every 6 (six) hours as needed.    [provider]    Allergies    Patient has no known allergies.  Review of Systems   Review of Systems  Constitutional:  Negative for chills, fatigue and fever.  HENT:  Positive for dental problem, facial swelling and sinus pressure. Negative for congestion, drooling, ear pain, rhinorrhea, sore throat, trouble swallowing and voice change.   Eyes:  Negative for pain and visual disturbance.  Respiratory:  Negative for cough, chest tightness and shortness of breath.   Cardiovascular:  Negative for chest pain and palpitations.  Gastrointestinal:  Negative for abdominal pain, nausea and vomiting.  Musculoskeletal:  Negative for arthralgias, back pain, myalgias, neck pain and neck stiffness.  Skin:  Negative for color change and rash.  Neurological:  Negative for dizziness, seizures, syncope, weakness, light-headedness, numbness and headaches.  Hematological:  Does not bruise/bleed easily.  Psychiatric/Behavioral:  Negative for confusion and decreased concentration.   All other  systems reviewed and are negative.  Physical Exam Updated Vital Signs BP (!) 141/94 (BP Location: Right Arm)   Pulse 86   Temp 98.6 F (37 C) (Oral)   Resp 14   Ht 5\' 9"  (1.753 m)   Wt 65.8 kg   SpO2 100%   BMI 21.41 kg/m   Physical Exam Vitals and nursing note reviewed.  Constitutional:      General: He is not in acute distress.    Appearance: Normal appearance. He is well-developed and normal weight. He is not ill-appearing, toxic-appearing or diaphoretic.  HENT:     Head: Normocephalic and atraumatic.     Right Ear: External  ear normal.     Left Ear: External ear normal.     Nose: Nose normal. No congestion.     Mouth/Throat:     Mouth: Mucous membranes are moist.     Comments: Poor dentition throughout mouth.  Tenderness on anterior gumline of left upper incisor.  No appreciable swelling on palpation.  No areas of fluctuance.  Patient is able to spit out purulent saliva. Eyes:     Conjunctiva/sclera: Conjunctivae normal.  Cardiovascular:     Rate and Rhythm: Normal rate and regular rhythm.     Heart sounds: No murmur heard. Pulmonary:     Effort: Pulmonary effort is normal. No respiratory distress.     Breath sounds: Normal breath sounds.  Abdominal:     General: Abdomen is flat.     Palpations: Abdomen is soft.  Musculoskeletal:        General: Normal range of motion.     Cervical back: Neck supple.     Right lower leg: No edema.  Skin:    General: Skin is warm and dry.     Coloration: Skin is not jaundiced or pale.  Neurological:     General: No focal deficit present.     Mental Status: He is alert.     Cranial Nerves: No cranial nerve deficit.     Sensory: No sensory deficit.     Motor: No weakness.  Psychiatric:        Mood and Affect: Mood normal.        Behavior: Behavior normal.        Thought Content: Thought content normal.        Judgment: Judgment normal.    ED Results / Procedures / Treatments   Labs (all labs ordered are listed, but only abnormal results are displayed) Labs Reviewed - No data to display  EKG None  Radiology No results found.  Procedures Procedures   Medications Ordered in ED Medications - No data to display  ED Course  I have reviewed the triage vital signs and the nursing notes.  Pertinent labs & imaging results that were available during my care of the patient were reviewed by me and considered in my medical decision making (see chart for details).    MDM Rules/Calculators/A&P                          Patient presents for concern of  odontogenic infection.  He denies any systemic symptoms.  While awaiting being bedded in the ED, he did have spontaneous drainage of an area of swelling overlying his left upper incisor.  No appreciable swelling or areas of fluctuance on exam.  Patient does appear to be spitting out purulent materials.  He does have a dentist appointment for tomorrow.  Antibiotics were prescribed.  Patient was advised to keep his dentist appointment and to return to the ED for any worsening of symptoms.  He was discharged in good condition.  Final Clinical Impression(s) / ED Diagnoses Final diagnoses:  Tooth abscess    Rx / DC Orders ED Discharge Orders          Ordered    amoxicillin-clavulanate (AUGMENTIN) 875-125 MG tablet  Every 12 hours        08/26/21 1139             Gloris Manchester, MD 08/26/21 1140

## 2021-08-26 NOTE — Discharge Instructions (Signed)
Prescription for antibiotics is attached.  Take as prescribed.  Keep your dentist appointment for tomorrow.  If you develop any worsening symptoms, please return to the emergency department.

## 2021-08-26 NOTE — ED Triage Notes (Signed)
Dental pain left upper back; onset Friday. Sent by [employer].

## 2023-07-30 IMAGING — DX DG CHEST 1V PORT
1 series · 1 of 1 positions shown · non-contrast
Comparison: Chest radiograph dated 08/27/2019.

CLINICAL DATA: Shortness of breath.

EXAM:
PORTABLE CHEST 1 VIEW

[chest]
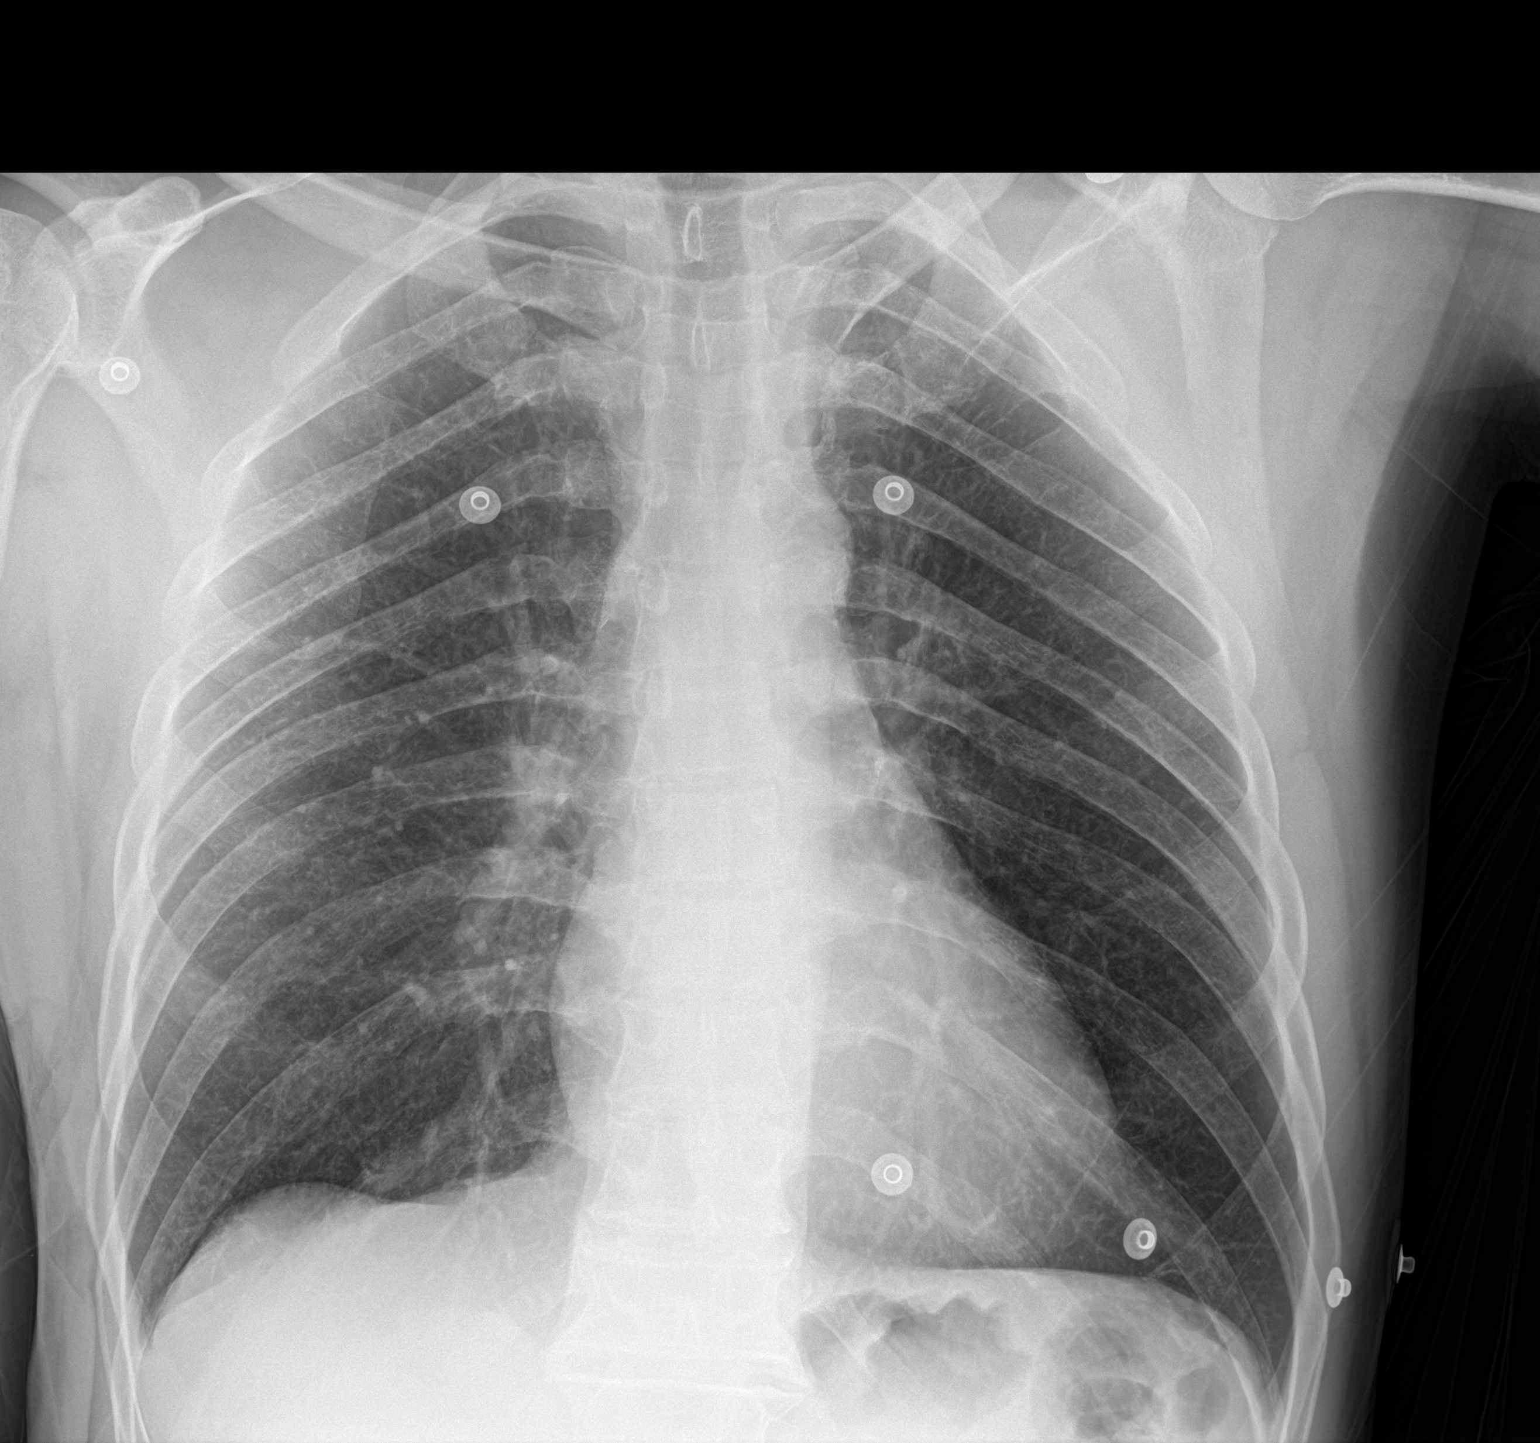

[1 of 1 positions shown; findings below may reference images not displayed]

FINDINGS: The heart size and mediastinal contours are within normal limits.
Both lungs are clear. The visualized skeletal structures are
unremarkable.
IMPRESSION: No active disease.

## 2024-02-22 DIAGNOSIS — F17210 Nicotine dependence, cigarettes, uncomplicated: Secondary | ICD-10-CM | POA: Insufficient documentation

## 2024-02-22 DIAGNOSIS — K219 Gastro-esophageal reflux disease without esophagitis: Secondary | ICD-10-CM | POA: Insufficient documentation

## 2024-11-28 ENCOUNTER — Other Ambulatory Visit: Payer: Self-pay

## 2024-11-28 ENCOUNTER — Emergency Department (HOSPITAL_COMMUNITY)

## 2024-11-28 ENCOUNTER — Observation Stay (HOSPITAL_COMMUNITY)
Admission: EM | Admit: 2024-11-28 | Discharge: 2024-11-29 | Disposition: A | Discharge status: Home / Self Care | Attending: Internal Medicine | Admitting: Internal Medicine

## 2024-11-28 DIAGNOSIS — R030 Elevated blood-pressure reading, without diagnosis of hypertension: Secondary | ICD-10-CM | POA: Insufficient documentation

## 2024-11-28 DIAGNOSIS — F17210 Nicotine dependence, cigarettes, uncomplicated: Secondary | ICD-10-CM | POA: Insufficient documentation

## 2024-11-28 DIAGNOSIS — I10 Essential (primary) hypertension: Secondary | ICD-10-CM | POA: Diagnosis not present

## 2024-11-28 DIAGNOSIS — I639 Cerebral infarction, unspecified: Secondary | ICD-10-CM | POA: Diagnosis not present

## 2024-11-28 DIAGNOSIS — Z720 Tobacco use: Secondary | ICD-10-CM | POA: Diagnosis not present

## 2024-11-28 DIAGNOSIS — R29701 NIHSS score 1: Secondary | ICD-10-CM

## 2024-11-28 DIAGNOSIS — G459 Transient cerebral ischemic attack, unspecified: Secondary | ICD-10-CM | POA: Diagnosis not present

## 2024-11-28 DIAGNOSIS — R2990 Unspecified symptoms and signs involving the nervous system: Principal | ICD-10-CM

## 2024-11-28 DIAGNOSIS — E785 Hyperlipidemia, unspecified: Secondary | ICD-10-CM | POA: Diagnosis not present

## 2024-11-28 LAB — COMPREHENSIVE METABOLIC PANEL WITH GFR
ALT: 15 U/L (ref 0–44)
AST: 29 U/L (ref 15–41)
Albumin: 4.5 g/dL (ref 3.5–5.0)
Alkaline Phosphatase: 59 U/L (ref 38–126)
Anion gap: 10 (ref 5–15)
BUN: 11 mg/dL (ref 6–20)
CO2: 25 mmol/L (ref 22–32)
Calcium: 9.2 mg/dL (ref 8.9–10.3)
Chloride: 101 mmol/L (ref 98–111)
Creatinine, Ser: 1.08 mg/dL (ref 0.61–1.24)
GFR, Estimated: >60 mL/min
Glucose, Bld: 101 mg/dL — ABNORMAL HIGH (ref 70–99)
Potassium: 4.2 mmol/L (ref 3.5–5.1)
Sodium: 136 mmol/L (ref 135–145)
Total Bilirubin: 0.5 mg/dL (ref 0.0–1.2)
Total Protein: 7.4 g/dL (ref 6.5–8.1)

## 2024-11-28 LAB — I-STAT CHEM 8, ED
BUN: 12 mg/dL (ref 6–20)
Calcium, Ion: 1.12 mmol/L — ABNORMAL LOW (ref 1.15–1.40)
Chloride: 102 mmol/L (ref 98–111)
Creatinine, Ser: 1.1 mg/dL (ref 0.61–1.24)
Glucose, Bld: 101 mg/dL — ABNORMAL HIGH (ref 70–99)
HCT: 47 % (ref 39.0–52.0)
Hemoglobin: 16 g/dL (ref 13.0–17.0)
Potassium: 4.1 mmol/L (ref 3.5–5.1)
Sodium: 137 mmol/L (ref 135–145)
TCO2: 23 mmol/L (ref 22–32)

## 2024-11-28 LAB — ECHOCARDIOGRAM COMPLETE
Area-P 1/2: 3.77 cm2
Height: 69 in
S' Lateral: 3.5 cm
Single Plane A2C EF: 53.8 %
Weight: 2539.7 [oz_av]

## 2024-11-28 LAB — CBC
HCT: 44.9 % (ref 39.0–52.0)
Hemoglobin: 15.6 g/dL (ref 13.0–17.0)
MCH: 32.6 pg (ref 26.0–34.0)
MCHC: 34.7 g/dL (ref 30.0–36.0)
MCV: 93.7 fL (ref 80.0–100.0)
Platelets: 350 10*3/uL (ref 150–400)
RBC: 4.79 MIL/uL (ref 4.22–5.81)
RDW: 12.6 % (ref 11.5–15.5)
WBC: 11.6 10*3/uL — ABNORMAL HIGH (ref 4.0–10.5)
nRBC: 0 % (ref 0.0–0.2)

## 2024-11-28 LAB — DIFFERENTIAL
Abs Immature Granulocytes: 0.04 10*3/uL (ref 0.00–0.07)
Basophils Absolute: 0.1 10*3/uL (ref 0.0–0.1)
Basophils Relative: 1 %
Eosinophils Absolute: 0.4 10*3/uL (ref 0.0–0.5)
Eosinophils Relative: 4 %
Immature Granulocytes: 0 %
Lymphocytes Relative: 27 %
Lymphs Abs: 3.1 10*3/uL (ref 0.7–4.0)
Monocytes Absolute: 0.7 10*3/uL (ref 0.1–1.0)
Monocytes Relative: 6 %
Neutro Abs: 7.2 10*3/uL (ref 1.7–7.7)
Neutrophils Relative %: 62 %

## 2024-11-28 LAB — APTT: aPTT: 29 s (ref 24–36)

## 2024-11-28 LAB — PROTIME-INR
INR: 1 (ref 0.8–1.2)
Prothrombin Time: 13.4 s (ref 11.4–15.2)

## 2024-11-28 LAB — ETHANOL: Alcohol, Ethyl (B): <15 mg/dL

## 2024-11-28 LAB — CBG MONITORING, ED: Glucose-Capillary: 114 mg/dL — ABNORMAL HIGH (ref 70–99)

## 2024-11-28 MED ORDER — NICOTINE 14 MG/24HR TD PT24
14.0000 mg | MEDICATED_PATCH | Freq: Every day | TRANSDERMAL | Status: DC
  Administered 2024-11-28 – 2024-11-29 (×2): 14 mg via TRANSDERMAL
  Filled 2024-11-28 (×2): qty 1

## 2024-11-28 MED ORDER — PANTOPRAZOLE SODIUM 40 MG PO TBEC
40.0000 mg | DELAYED_RELEASE_TABLET | Freq: Two times a day (BID) | ORAL | Status: DC
  Administered 2024-11-28 – 2024-11-29 (×2): 40 mg via ORAL
  Filled 2024-11-28 (×2): qty 1

## 2024-11-28 MED ORDER — ASPIRIN 325 MG PO TBEC
325.0000 mg | DELAYED_RELEASE_TABLET | Freq: Once | ORAL | Status: AC
  Administered 2024-11-28: 325 mg via ORAL
  Filled 2024-11-28: qty 1

## 2024-11-28 MED ORDER — ENOXAPARIN SODIUM 40 MG/0.4ML IJ SOSY
40.0000 mg | PREFILLED_SYRINGE | INTRAMUSCULAR | Status: DC
  Administered 2024-11-29: 40 mg via SUBCUTANEOUS
  Filled 2024-11-28: qty 0.4

## 2024-11-28 MED ORDER — ACETAMINOPHEN 325 MG PO TABS: 650.0000 mg | ORAL_TABLET | ORAL | Status: DC | PRN

## 2024-11-28 MED ORDER — SUCRALFATE 1 G PO TABS
1.0000 g | ORAL_TABLET | Freq: Four times a day (QID) | ORAL | Status: DC
  Administered 2024-11-28 – 2024-11-29 (×3): 1 g via ORAL
  Filled 2024-11-28 (×4): qty 1

## 2024-11-28 MED ORDER — ACETAMINOPHEN 650 MG RE SUPP: 650.0000 mg | RECTAL | Status: DC | PRN

## 2024-11-28 MED ORDER — CLOPIDOGREL BISULFATE 75 MG PO TABS
75.0000 mg | ORAL_TABLET | Freq: Every day | ORAL | Status: DC
  Administered 2024-11-29: 75 mg via ORAL
  Filled 2024-11-28: qty 1

## 2024-11-28 MED ORDER — SODIUM CHLORIDE 0.9% FLUSH: 3.0000 mL | Freq: Once | INTRAVENOUS | Status: DC

## 2024-11-28 MED ORDER — CLOPIDOGREL BISULFATE 300 MG PO TABS
300.0000 mg | ORAL_TABLET | Freq: Once | ORAL | Status: AC
  Administered 2024-11-28: 300 mg via ORAL
  Filled 2024-11-28: qty 1

## 2024-11-28 MED ORDER — STROKE: EARLY STAGES OF RECOVERY BOOK
Freq: Once | Status: AC
  Filled 2024-11-28: qty 1

## 2024-11-28 MED ORDER — ACETAMINOPHEN 160 MG/5ML PO SOLN: 650.0000 mg | ORAL | Status: DC | PRN

## 2024-11-28 MED ORDER — IOHEXOL 350 MG/ML SOLN
75.0000 mL | Freq: Once | INTRAVENOUS | Status: AC | PRN
  Administered 2024-11-28: 75 mL via INTRAVENOUS

## 2024-11-28 MED ORDER — ASPIRIN 81 MG PO TBEC
81.0000 mg | DELAYED_RELEASE_TABLET | Freq: Every day | ORAL | Status: DC
  Administered 2024-11-29: 81 mg via ORAL
  Filled 2024-11-28: qty 1

## 2024-11-28 MED ORDER — SENNOSIDES-DOCUSATE SODIUM 8.6-50 MG PO TABS: 1.0000 | ORAL_TABLET | Freq: Every evening | ORAL | Status: DC | PRN

## 2024-11-28 MED ORDER — PANTOPRAZOLE SODIUM 40 MG PO TBEC: 40.0000 mg | DELAYED_RELEASE_TABLET | Freq: Every day | ORAL | Status: DC

## 2024-11-28 NOTE — H&P (Signed)
 " History and Physical    Patient: Isaac Steele FMW:[id number] DOB: [birth year] DOA: 11/28/2024 DOS: the patient was seen and examined on 11/28/2024 PCP: Charolet Yvonna SQUIBB, PA-C  Patient coming from: Home  Chief Complaint:  Chief Complaint  Patient presents with   Code Stroke   Cerebrovascular Accident   HPI: Isaac Steele is a 54 y.o. male with medical history significant for tobacco abuse who presents after episode at work today.  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  The face was the most significant. He said he felt like he had Novocaine injection in his face.  He denied any weakness or dizziness or headache or slurred speech.  He drove himself to the emergency department.  In the emergency department the patient was treated as a code stroke.  He had a CT scan of the head and a CTA done quickly.  They were negative for large vessel occlusion or CVA.  Neurology has recommended aspirin  and loading dose of Plavix .  The patient has had an MRI and an echocardiogram.  He received the aspirin  and Plavix  in the emergency department and reports that his symptoms started to get better shortly after taking those medications.  About 5:00 is the time he says that he felt that most of his symptoms were resolved.  There was only a tiny bit of numbness in his face lingering. The patient will be admitted to the hospital service for further workup and management.  Review of Systems: As mentioned in the history of present illness. All other systems reviewed and are negative. No past medical history on file. No past surgical history on file. Social History:  reports that he has been smoking cigarettes. He has been exposed to tobacco smoke. He has never used smokeless tobacco. He reports current alcohol use of about 12.0 standard drinks of alcohol per week. He reports that he does not use drugs.  Allergies[1]  No family history on  file.  Prior to Admission medications  Medication Sig Start Date End Date Taking? Authorizing Provider  Aspirin -Acetaminophen -Caffeine (GOODYS EXTRA STRENGTH) 500-325-65 MG PACK Take 1 Package by mouth as needed.   Yes [provider]  pantoprazole  (PROTONIX ) 40 MG tablet Take 40 mg by mouth 2 (two) times daily. 03/05/21  Yes [provider]  sucralfate  (CARAFATE ) 1 g tablet Take 1 g by mouth 4 (four) times daily. 09/25/24 03/24/25 Yes [provider]  amoxicillin -clavulanate (AUGMENTIN ) 875-125 MG tablet Take 1 tablet by mouth every 12 (twelve) hours. Patient not taking: Reported on 11/28/2024 08/26/21   Melvenia Motto, MD  famotidine (PEPCID) 20 MG tablet Take 20 mg by mouth daily. Patient not taking: Reported on 11/28/2024 06/22/20   [provider]  ibuprofen  (ADVIL ,MOTRIN ) 200 MG tablet Take 200 mg by mouth every 6 (six) hours as needed. Patient not taking: Reported on 11/28/2024    [provider]    Physical Exam: Vitals:   11/28/24 1500 11/28/24 1515 11/28/24 1545 11/28/24 1838  BP:  (!) 163/90 (!) 161/95 (!) 171/111  Pulse:   72 77  Resp:   18 14  Temp:      SpO2:   97% 100%  Weight: 72 kg     Height:       Physical Exam:  General: No acute distress, well developed, well nourished HEENT: Normocephalic, atraumatic, PERRL Cardiovascular: Normal rate and rhythm. Distal pulses intact. Pulmonary: Normal pulmonary effort,  very mild exp wheeze in left base Gastrointestinal: Nondistended abdomen, soft, non-tender, normoactive bowel sounds Musculoskeletal:Normal ROM, no lower ext edema Lymphadenopathy: No cervical LAD. Skin: Skin is warm and dry. Neuro: No focal deficits noted, AAOx3. Strength symmetric.  He reports very mild numbness in his right face.  Very slight clumsiness in right hand rapid alternating movements and past pointing of nose with right hand. PSYCH: Attentive and cooperative  Data Reviewed:    Assessment and  Plan: Possible CVA - Appreciate neurology input - CT head and CTA without acute findings - He was not aspirin  previously. Aspirin  and Plavix  ordered by neurology - MRI pending - Echo completed  2. Tobacco abuse -cessation counseling needed - 14mg  Nicotine  patch.  He smokes 1 pack/day  3.  BP is high.  Patient does not have any history of high blood pressure - Permissive hypertension at this time.  Monitor    Advance Care Planning:   Code Status: Full Code  The patient names his wife as a runner, broadcasting/film/video.  He wants to be full code. Consults: Neurology  Family Communication: The patient's wife was at bedside  Severity of Illness: The appropriate patient status for this patient is OBSERVATION. Observation status is judged to be reasonable and necessary in order to provide the required intensity of service to ensure the patient's safety. The patient's presenting symptoms, physical exam findings, and initial radiographic and laboratory data in the context of their medical condition is felt to place them at decreased risk for further clinical deterioration. Furthermore, it is anticipated that the patient will be medically stable for discharge from the hospital within 2 midnights of admission.   Author: ARTHEA CHILD, MD 11/28/2024 7:09 PM  For on call review [email or web address].     [1] No Known Allergies  "

## 2024-11-28 NOTE — Progress Notes (Signed)
" °  Echocardiogram 2D Echocardiogram has been performed.  Koleen KANDICE Popper, RDCS 11/28/2024, 5:26 PM "

## 2024-11-28 NOTE — ED Notes (Addendum)
 Echo at the bedside

## 2024-11-28 NOTE — ED Provider Notes (Addendum)
 " [facility] AT [facility] Provider Note   CSN: [id number] Arrival date & time: 11/28/24  1435     Patient presents with: Code Stroke and Cerebrovascular Accident   Isaac Steele is a 54 y.o. male.    Cerebrovascular Accident  Patient presents for strokelike symptoms.  Medical history includes GERD, tobacco use.  Approximately 1 hour prior to arrival, patient was at work garment/textile technologist).  He had sudden onset of numbness and paresthesias to right face, arm, and leg.  Symptoms have been persistent since that time.  He denies any other current or recent symptoms.     Prior to Admission medications  Medication Sig Start Date End Date Taking? Authorizing Provider  amoxicillin -clavulanate (AUGMENTIN ) 875-125 MG tablet Take 1 tablet by mouth every 12 (twelve) hours. 08/26/21   Melvenia Motto, MD  Aspirin -Acetaminophen -Caffeine (GOODYS EXTRA STRENGTH) 500-325-65 MG PACK Take 1 Package by mouth as needed.    [provider]  famotidine (PEPCID) 20 MG tablet Take 20 mg by mouth daily. 06/22/20   [provider]  ibuprofen  (ADVIL ,MOTRIN ) 200 MG tablet Take 200 mg by mouth every 6 (six) hours as needed.    [provider]  pantoprazole  (PROTONIX ) 40 MG tablet Take 40 mg by mouth 2 (two) times daily. 03/05/21   [provider]    Allergies: Patient has no known allergies.    Review of Systems  Neurological:  Positive for numbness.  All other systems reviewed and are negative.   Updated Vital Signs BP (!) 157/97   Pulse 79   Temp 97.9 F (36.6 C)   Resp 18   Ht 5' 9 (1.753 m)   Wt 72 kg   SpO2 100%   BMI 23.44 kg/m   Physical Exam Vitals and nursing note reviewed.  Constitutional:      General: He is not in acute distress.    Appearance: Normal appearance. He is well-developed. He is not ill-appearing, toxic-appearing or diaphoretic.  HENT:     Head: Normocephalic and atraumatic.     Right Ear: External ear normal.      Left Ear: External ear normal.     Nose: Nose normal.     Mouth/Throat:     Mouth: Mucous membranes are moist.  Eyes:     Extraocular Movements: Extraocular movements intact.     Conjunctiva/sclera: Conjunctivae normal.  Cardiovascular:     Rate and Rhythm: Normal rate and regular rhythm.  Pulmonary:     Effort: Pulmonary effort is normal. No respiratory distress.  Abdominal:     General: There is no distension.     Palpations: Abdomen is soft.     Tenderness: There is no abdominal tenderness.  Musculoskeletal:        General: No swelling. Normal range of motion.     Cervical back: Normal range of motion and neck supple.  Skin:    General: Skin is warm and dry.     Capillary Refill: Capillary refill takes less than 2 seconds.     Coloration: Skin is not jaundiced or pale.  Neurological:     Mental Status: He is alert and oriented to person, place, and time.     Cranial Nerves: No cranial nerve deficit.     Sensory: Sensory deficit present.     Motor: No weakness.     Coordination: Coordination normal.  Psychiatric:        Mood and Affect: Mood normal.  Behavior: Behavior normal.     (all labs ordered are listed, but only abnormal results are displayed) Labs Reviewed  CBC - Abnormal; Notable for the following components:      Result Value   WBC 11.6 (*)    All other components within normal limits  I-STAT CHEM 8, ED - Abnormal; Notable for the following components:   Glucose, Bld 101 (*)    Calcium , Ion 1.12 (*)    All other components within normal limits  CBG MONITORING, ED - Abnormal; Notable for the following components:   Glucose-Capillary 114 (*)    All other components within normal limits  PROTIME-INR  APTT  DIFFERENTIAL  COMPREHENSIVE METABOLIC PANEL WITH GFR  ETHANOL    EKG: None  Radiology: CT HEAD CODE STROKE WO CONTRAST Result Date: 11/28/2024 EXAM: CT HEAD WITHOUT CONTRAST 11/28/2024 03:23:34 PM TECHNIQUE: CT of the head was performed  without the administration of intravenous contrast. Automated exposure control, iterative reconstruction, and/or weight based adjustment of the mA/kV was utilized to reduce the radiation dose to as low as reasonably achievable. COMPARISON: None available. CLINICAL HISTORY: Neuro deficit, acute, stroke suspected. Sudden onset of left sided tingling. FINDINGS: BRAIN AND VENTRICLES: There is no evidence of an acute infarct, intracranial hemorrhage, mass, midline shift, hydrocephalus, or extra-axial fluid collection. Cerebral volume is normal. Patchy hypodensities in the cerebral white matter bilaterally are nonspecific but compatible with mild to moderate chronic small vessel ischemic disease. ORBITS: Remote medial left orbital fracture. SINUSES: Mild mucosal thickening in the ethmoid sinuses. Clear mastoid air cells. SOFT TISSUES AND SKULL: No acute soft tissue abnormality. No skull fracture. [place] Stroke Program Early CT Score (ASPECTS) ----- Ganglionic (caudate, IC, lentiform nucleus, insula, M1-M3): 7 Supraganglionic (M4-M6): 3 Total: 10 IMPRESSION: 1. No acute intracranial abnormality. ASPECTS of 10. 2. Mild to moderate chronic small vessel ischemic disease. 3. These results were communicated to Dr. Jerri at 3:29 pm on 11/28/2024 by secure text page via the [facility] messaging system. Electronically signed by: Dasie Hamburg MD 11/28/2024 03:30 PM EST RP Workstation: [id number]     Procedures   Medications Ordered in the ED  sodium chloride  flush (NS) 0.9 % injection 3 mL (has no administration in time range)  iohexol  (OMNIPAQUE ) 350 MG/ML injection 75 mL (75 mLs Intravenous Contrast Given 11/28/24 1524)                                    Medical Decision Making Amount and/or Complexity of Data Reviewed Labs: ordered. Radiology: ordered.  Risk Decision regarding hospitalization.   This patient presents to the ED for concern of strokelike symptoms, this involves an extensive number of treatment  options, and is a complaint that carries with it a high risk of complications and morbidity.  The differential diagnosis includes CVA, TIA, seizure, metabolic derangements, neoplasm   Co morbidities / Chronic conditions that complicate the patient evaluation  GERD, tobacco use   Additional history obtained:  Additional history obtained from EMR External records from outside source obtained and reviewed including N/A   Lab Tests:  I Ordered, and personally interpreted labs.  The pertinent results include: Slight leukocytosis, normal kidney function, normal electrolytes   Imaging Studies ordered:  I ordered imaging studies including CT head, CTA head and neck, MRI brain I independently visualized and interpreted imaging which showed no acute findings on CT scan.  Remaining imaging pending at time of admission. I agree  with the radiologist interpretation   Cardiac Monitoring: / EKG:  The patient was maintained on a cardiac monitor.  I personally viewed and interpreted the cardiac monitored which showed an underlying rhythm of: Sinus rhythm   Problem List / ED Course / Critical interventions / Medication management  Patient presenting for acute onset of right face and hemibody numbness and paresthesias.  On arrival in the ED, symptoms are ongoing.  On exam, patient has diminished in station to right hemibody.  No other focal deficits present.  Patient arrives as a code stroke.  He was taken directly to CT scanner.  Noncontrasted CT scan and CTA were unremarkable.  I spoke with neurologist, Dr. Jerri, who recommends no acute interventions but would like patient admitted for further stroke workup.  Patient's lab work is unremarkable.  He had mild improvement in his symptoms while in the ED.  Patient was admitted for further management.  Consultations Obtained:  I requested consultation with the neurologist, Dr. Jerri,  and discussed lab and imaging findings as well as pertinent plan - they  recommend: Admission for stroke workup   Social Determinants of Health:  Lives independently     Final diagnoses:  Stroke-like symptoms    ED Discharge Orders     None          Melvenia Motto, MD 11/28/24 1534    Melvenia Motto, MD 11/28/24 1810  "

## 2024-11-28 NOTE — Code Documentation (Signed)
 Stroke Response Nurse Documentation Code Documentation  LEYLAND KENNA is a 54 y.o. male arriving to [place]  via Private Vehicle on 11/28/2024 with past medical hx of GERD, smoker. On No antithrombotic. Code stroke was activated by ED.   Patient from work where he was LKW at 1400 and now complaining of rt sided numbness .   Stroke team at the bedside on patient arrival. Labs drawn and patient cleared for CT by Dr. Melvenia. Patient to CT with team. NIHSS 1, see documentation for details and code stroke times. Patient with right decreased sensation on exam. The following imaging was completed:  CT Head and CTA. Patient is not a candidate for IV Thrombolytic due to TMTT. Patient is not a candidate for IR due to no LVO on advanced imaging..   Care Plan:    In window monitoring: q28min NIHSS and VS until OOW (1830), then q 2 for 12 hours, then q 4.   Process Delays Noted: POV arrival  Bedside handoff with ED RN Crystal   Shanon Richardson Colt  Stroke Response RN

## 2024-11-28 NOTE — ED Triage Notes (Signed)
 Pt. Stated, I was putting a heating coil in a car and all of sudden everything on the left side was tingling like I was at the dentist. No arm drift, =grips, symmetrical smile.

## 2024-11-28 NOTE — Consult Note (Signed)
 NEUROLOGY CONSULT NOTE   Date of service: November 28, 2024 Patient Name: Isaac Steele MRN:  [id number] DOB:  23-May-1971 Chief Complaint: Acute onset right-sided numbness Requesting Provider: Melvenia Motto, MD  History of Present Illness  Isaac Steele is a 54 y.o. male with hx of GERD and smoking who presents with acute onset right-sided numbness.  Patient states that he was at work in his usual state of health and suddenly felt numbness in his right arm leg and face.  He talked to his wife, who is an EMT, who advised him to come to the emergency department.  He denies unilateral weakness or any other symptoms.  He is smoker, 1 PPD. He stated that drinks alcohol but sparsely, probably 2 beers per week. Denies illicit drug use.   LKW: 1400 Modified rankin score: 0-Completely asymptomatic and back to baseline post- stroke IV Thrombolysis: No, too mild to treat, nondisabling EVT: No, no LVO  NIHSS components Score: Comment  1a Level of Conscious 0[x]  1[]  2[]  3[]      1b LOC Questions 0[x]  1[]  2[]       1c LOC Commands 0[x]  1[]  2[]       2 Best Gaze 0[x]  1[]  2[]       3 Visual 0[x]  1[]  2[]  3[]      4 Facial Palsy 0[x]  1[]  2[]  3[]      5a Motor Arm - left 0[x]  1[]  2[]  3[]  4[]  UN[]    5b Motor Arm - Right 0[x]  1[]  2[]  3[]  4[]  UN[]    6a Motor Leg - Left 0[x]  1[]  2[]  3[]  4[]  UN[]    6b Motor Leg - Right 0[x]  1[]  2[]  3[]  4[]  UN[]    7 Limb Ataxia 0[x]  1[]  2[]  UN[]      8 Sensory 0[]  1[x]  2[]  UN[]      9 Best Language 0[x]  1[]  2[]  3[]      10 Dysarthria 0[x]  1[]  2[]  UN[]      11 Extinct. and Inattention 0[x]  1[]  2[]       TOTAL:1       ROS  Comprehensive ROS performed and pertinent positives documented in HPI   Past History  No past medical history on file.  No past surgical history on file.  Family History: No family history on file.  Social History  reports that he has been smoking cigarettes. He has been exposed to tobacco smoke. He has never used smokeless tobacco. He reports current  alcohol use of about 12.0 standard drinks of alcohol per week. He reports that he does not use drugs.  Allergies[1]  Medications  Current Medications[2]  Vitals   Vitals:   11/28/24 1456 11/28/24 1500 11/28/24 1515 11/28/24 1545  BP:   (!) 163/90 (!) 161/95  Pulse:    72  Resp:    18  Temp:      SpO2:    97%  Weight: 66.7 kg 72 kg    Height: 5' 9 (1.753 m)       Body mass index is 23.44 kg/m.   Physical Exam   Constitutional: Appears well-developed and well-nourished.  Psych: Affect appropriate to situation.  Eyes: No scleral injection.  HENT: No OP obstruction.  Head: Normocephalic.  Respiratory: Effort normal, non-labored breathing.  Skin: WDI.   Neurologic Examination    NEURO:  Mental Status: AA&Ox3, able to give clear and coherent history of present illness Speech/Language: speech is without dysarthria or aphasia.  Fluency, and comprehension intact.  Cranial Nerves:  II: PERRL. Visual fields full.  III, IV, VI: EOMI. Eyelids elevate  symmetrically.  V: Sensation is intact to light touch and diminished on the right VII: Smile is symmetrical.  VIII: hearing intact to voice. IX, X: Phonation is normal.  XII: tongue is midline without fasciculations. Motor: 5/5 strength to all muscle groups tested.  Tone: is normal and bulk is normal Sensation- Intact to light touch bilaterally but diminished on the right in the arm and leg. Extinction absent to light touch to DSS.   Coordination: FTN intact bilaterally, HKS: no ataxia in BL Gait- deferred   Labs/Imaging/Neurodiagnostic studies   CBC:  Recent Labs  Lab 2024/12/10 1505 12/10/2024 1509  WBC 11.6*  --   NEUTROABS 7.2  --   HGB 15.6 16.0  HCT 44.9 47.0  MCV 93.7  --   PLT 350  --    Basic Metabolic Panel:  Lab Results  Component Value Date   NA 137 12-10-2024   K 4.1 12-10-24   CO2 25 12/10/24   GLUCOSE 101 (H) 12-10-24   BUN 12 2024/12/10   CREATININE 1.10 December 10, 2024   CALCIUM  9.2  10-Dec-2024   GFRNONAA >60 December 10, 2024   GFRAA  04/02/2010    >60        The eGFR has been calculated using the MDRD equation. This calculation has not been validated in all clinical situations. eGFR's persistently <60 mL/min signify possible Chronic Kidney Disease.   Lipid Panel: No results found for: LDLCALC HgbA1c: No results found for: HGBA1C Urine Drug Screen: No results found for: LABOPIA, COCAINSCRNUR, LABBENZ, AMPHETMU, THCU, LABBARB  Alcohol Level     Component Value Date/Time   [facility] <15 12-10-24 1505   INR  Lab Results  Component Value Date   INR 1.0 Dec 10, 2024   APTT  Lab Results  Component Value Date   APTT 29 10-Dec-2024   CT Head without contrast(Personally reviewed): No acute abnormality  CT angio Head and Neck with contrast(Personally reviewed): No LVO  MRI Brain(Personally reviewed): Pending   ASSESSMENT   Isaac Steele is a 54 y.o. male with history of GERD and smoking who presents with acute onset numbness in the right side of the face, arm and leg.  Symptoms occurred suddenly while patient was at work, and he weakness or any other symptoms.  Initial head CT revealed no acute abnormality, and no LVO was seen on CTA.  Symptoms are nondisabling, too mild to treat with TNK, so we will monitor patient closely until he is out of the window and treat if he worsens.  Suspect a small stroke as the etiology of the symptoms, especially given risk factor of smoking, so will perform MRI and admit for stroke workup.  RECOMMENDATIONS  Stroke/TIA Workup  - Admit by hospitalist service for stroke workup - Permissive HTN x48 hrs from sx onset or until stroke ruled out by MRI goal BP <220/110. PRN labetalol or hydralazine if BP above these parameters. Avoid oral antihypertensives. - MRI brain wo contrast - TTE - Check A1c and LDL and UDS - add statin per guidelines based on LDL - once pass swallow screening, will do Aspirin  325 and plavix  300  load, followed by ASA 81 mg daily and Plavix  75 mg daily  - q4 hr neuro checks - Tele - PT/OT - Stroke education - Amb referral to neurology upon discharge   ______________________________________________________________________  Patient seen by NP with MD, MD to edit note as needed.  Signed, Cortney E Everitt Clint Kill, NP [facility]   ATTENDING NOTE: I reviewed above note and  agree with the assessment and plan. Pt was seen and examined.   Pt came in with acute right sided sensory decrease on the right. NIHSS = 1. Nondisabling and too mild to treat with TNK. CTA neg for LVO. Will admit for stroke work up as above. Stroke team to follow.   For detailed assessment and plan, please refer to above as I have made changes wherever appropriate.   Ary Cummins, MD PhD Stroke Neurology 11/28/2024 4:12 PM       [1] No Known Allergies [2]  Current Facility-Administered Medications:    [START ON 11/29/2024]  stroke: early stages of recovery book, , Does not apply, Once, Cummins Ary, MD   aspirin  EC tablet 325 mg, 325 mg, Oral, Once **FOLLOWED BY** [START ON 11/29/2024] aspirin  EC tablet 81 mg, 81 mg, Oral, Daily, Cummins Ary, MD   clopidogrel  (PLAVIX ) tablet 300 mg, 300 mg, Oral, Once **FOLLOWED BY** [START ON 11/29/2024] clopidogrel  (PLAVIX ) tablet 75 mg, 75 mg, Oral, Daily, Cummins Ary, MD   sodium chloride  flush (NS) 0.9 % injection 3 mL, 3 mL, Intravenous, Once, Melvenia Motto, MD  Current Outpatient Medications:    amoxicillin -clavulanate (AUGMENTIN ) 875-125 MG tablet, Take 1 tablet by mouth every 12 (twelve) hours., Disp: 14 tablet, Rfl: 0   Aspirin -Acetaminophen -Caffeine (GOODYS EXTRA STRENGTH) 500-325-65 MG PACK, Take 1 Package by mouth as needed., Disp: , Rfl:    famotidine (PEPCID) 20 MG tablet, Take 20 mg by mouth daily., Disp: , Rfl:    ibuprofen  (ADVIL ,MOTRIN ) 200 MG tablet, Take 200 mg by mouth every 6 (six) hours as needed., Disp: , Rfl:    pantoprazole  (PROTONIX ) 40 MG  tablet, Take 40 mg by mouth 2 (two) times daily., Disp: , Rfl:

## 2024-11-29 ENCOUNTER — Other Ambulatory Visit (HOSPITAL_COMMUNITY): Payer: Self-pay

## 2024-11-29 DIAGNOSIS — I10 Essential (primary) hypertension: Secondary | ICD-10-CM | POA: Diagnosis not present

## 2024-11-29 DIAGNOSIS — G459 Transient cerebral ischemic attack, unspecified: Secondary | ICD-10-CM | POA: Diagnosis not present

## 2024-11-29 DIAGNOSIS — I69391 Dysphagia following cerebral infarction: Secondary | ICD-10-CM

## 2024-11-29 DIAGNOSIS — F1090 Alcohol use, unspecified, uncomplicated: Secondary | ICD-10-CM | POA: Diagnosis not present

## 2024-11-29 DIAGNOSIS — I739 Peripheral vascular disease, unspecified: Secondary | ICD-10-CM | POA: Diagnosis not present

## 2024-11-29 DIAGNOSIS — E785 Hyperlipidemia, unspecified: Secondary | ICD-10-CM

## 2024-11-29 DIAGNOSIS — F17210 Nicotine dependence, cigarettes, uncomplicated: Secondary | ICD-10-CM | POA: Diagnosis not present

## 2024-11-29 LAB — COMPREHENSIVE METABOLIC PANEL WITH GFR
ALT: 16 U/L (ref 0–44)
AST: 23 U/L (ref 15–41)
Albumin: 4.1 g/dL (ref 3.5–5.0)
Alkaline Phosphatase: 55 U/L (ref 38–126)
Anion gap: 10 (ref 5–15)
BUN: 14 mg/dL (ref 6–20)
CO2: 24 mmol/L (ref 22–32)
Calcium: 9.1 mg/dL (ref 8.9–10.3)
Chloride: 101 mmol/L (ref 98–111)
Creatinine, Ser: 1.09 mg/dL (ref 0.61–1.24)
GFR, Estimated: >60 mL/min
Glucose, Bld: 95 mg/dL (ref 70–99)
Potassium: 3.5 mmol/L (ref 3.5–5.1)
Sodium: 135 mmol/L (ref 135–145)
Total Bilirubin: 0.7 mg/dL (ref 0.0–1.2)
Total Protein: 6.6 g/dL (ref 6.5–8.1)

## 2024-11-29 LAB — CBC
HCT: 43.2 % (ref 39.0–52.0)
Hemoglobin: 15.3 g/dL (ref 13.0–17.0)
MCH: 32.5 pg (ref 26.0–34.0)
MCHC: 35.4 g/dL (ref 30.0–36.0)
MCV: 91.7 fL (ref 80.0–100.0)
Platelets: 340 10*3/uL (ref 150–400)
RBC: 4.71 MIL/uL (ref 4.22–5.81)
RDW: 12.7 % (ref 11.5–15.5)
WBC: 9.5 10*3/uL (ref 4.0–10.5)
nRBC: 0 % (ref 0.0–0.2)

## 2024-11-29 LAB — LIPID PANEL
Cholesterol: 237 mg/dL — ABNORMAL HIGH (ref 0–200)
HDL: 49 mg/dL
LDL Cholesterol: 158 mg/dL — ABNORMAL HIGH (ref 0–99)
Total CHOL/HDL Ratio: 4.9 ratio
Triglycerides: 155 mg/dL — ABNORMAL HIGH
VLDL: 31 mg/dL (ref 0–40)

## 2024-11-29 LAB — HEMOGLOBIN A1C
Hgb A1c MFr Bld: 5.4 % (ref 4.8–5.6)
Mean Plasma Glucose: 108.28 mg/dL

## 2024-11-29 MED ORDER — CLOPIDOGREL BISULFATE 75 MG PO TABS: 75.0000 mg | ORAL_TABLET | Freq: Every day | ORAL | 0 refills | Status: AC

## 2024-11-29 MED ORDER — ATORVASTATIN CALCIUM 40 MG PO TABS
40.0000 mg | ORAL_TABLET | Freq: Every day | ORAL | 0 refills | Status: DC
  Filled 2024-11-29: qty 90, 90d supply, fill #0

## 2024-11-29 MED ORDER — ATORVASTATIN CALCIUM 40 MG PO TABS
40.0000 mg | ORAL_TABLET | Freq: Every day | ORAL | Status: DC
  Administered 2024-11-29: 40 mg via ORAL
  Filled 2024-11-29: qty 1

## 2024-11-29 MED ORDER — ATORVASTATIN CALCIUM 40 MG PO TABS: 40.0000 mg | ORAL_TABLET | Freq: Every day | ORAL | 0 refills | Status: AC

## 2024-11-29 MED ORDER — ASPIRIN 81 MG PO TBEC
81.0000 mg | DELAYED_RELEASE_TABLET | Freq: Every day | ORAL | 0 refills | Status: DC
  Filled 2024-11-29: qty 90, 90d supply, fill #0

## 2024-11-29 MED ORDER — POTASSIUM CHLORIDE CRYS ER 20 MEQ PO TBCR
40.0000 meq | EXTENDED_RELEASE_TABLET | Freq: Once | ORAL | Status: AC
  Administered 2024-11-29: 40 meq via ORAL
  Filled 2024-11-29: qty 2

## 2024-11-29 MED ORDER — CLOPIDOGREL BISULFATE 75 MG PO TABS
75.0000 mg | ORAL_TABLET | Freq: Every day | ORAL | 0 refills | Status: DC
  Filled 2024-11-29: qty 21, 21d supply, fill #0

## 2024-11-29 MED ORDER — ASPIRIN 81 MG PO TBEC: 81.0000 mg | DELAYED_RELEASE_TABLET | Freq: Every day | ORAL | 0 refills | Status: AC

## 2024-11-29 NOTE — Progress Notes (Signed)
 PT Cancellation Note  Patient Details Name: Isaac Steele MRN: [id number] DOB: 12-05-1970   Cancelled Treatment:    Reason Eval/Treat Not Completed: PT screened, no needs identified, will sign off (Per OT, pt is independent and back to baseline function.)  Randall SAUNDERS, PT, DPT Acute Rehabilitation Services Office: [phone] Secure Chat Preferred  [name] 11/29/2024, 8:23 AM

## 2024-11-29 NOTE — Progress Notes (Addendum)
 STROKE TEAM PROGRESS NOTE    INTERIM HISTORY/SUBJECTIVE Patient presented with transient right body paresthesias which appear to have resolved.  CT angiogram shows no large vessel stenosis or occlusion.  Echocardiogram is unremarkable.  LDL cholesterol is elevated at 158 despite patient taking home statin.  At the bedside.  Patient states that he had acute onset of right side numbness while at work which has completely resolved.  MRI brain negative for acute process  CBC    Component Value Date/Time   WBC 9.5 11/29/2024 0143   RBC 4.71 11/29/2024 0143   HGB 15.3 11/29/2024 0143   HCT 43.2 11/29/2024 0143   PLT 340 11/29/2024 0143   MCV 91.7 11/29/2024 0143   MCH 32.5 11/29/2024 0143   MCHC 35.4 11/29/2024 0143   RDW 12.7 11/29/2024 0143   LYMPHSABS 3.1 11/28/2024 1505   MONOABS 0.7 11/28/2024 1505   EOSABS 0.4 11/28/2024 1505   BASOSABS 0.1 11/28/2024 1505    BMET    Component Value Date/Time   NA 135 11/29/2024 0143   K 3.5 11/29/2024 0143   CL 101 11/29/2024 0143   CO2 24 11/29/2024 0143   GLUCOSE 95 11/29/2024 0143   BUN 14 11/29/2024 0143   CREATININE 1.09 11/29/2024 0143   CALCIUM  9.1 11/29/2024 0143   GFRNONAA >60 11/29/2024 0143    IMAGING past 24 hours MR BRAIN WO CONTRAST Result Date: 11/28/2024 EXAM: MRI BRAIN WITHOUT CONTRAST 11/28/2024 06:15:35 PM TECHNIQUE: Multiplanar multisequence MRI of the head/brain was performed without the administration of intravenous contrast. COMPARISON: CT head and CTA head and neck same day. CLINICAL HISTORY: Neuro deficit, acute, stroke suspected. FINDINGS: BRAIN AND VENTRICLES: No acute infarct. No intracranial hemorrhage. No mass. No midline shift. No hydrocephalus. The sella is unremarkable. Normal flow voids. There are scattered and confluent areas of T2 and FLAIR hyperintensity throughout the periventricular and subcortical white matter suggestive of moderate chronic microvascular ischemic changes. 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 SINUSES AND MASTOIDS: Mucosal thickening in the ethmoid sinus. Nasal septal defect measuring up to 2.7 cm in AP dimension. Trace fluid in the mastoid air cells. BONES AND SOFT TISSUES: Normal marrow signal. No soft tissue abnormality. IMPRESSION: 1. No acute findings. 2. Moderate chronic microvascular ischemic changes. Electronically signed by: Donnice Mania MD 11/28/2024 07:30 PM EST RP Workstation: [id number]   ECHOCARDIOGRAM COMPLETE Result Date: 11/28/2024    ECHOCARDIOGRAM REPORT   Patient Name:   DRAKE WUERTZ Rebman Date of Exam: 11/28/2024 Medical Rec #:  [id number]      Height:       69.0 in Accession #:    [id number]     Weight:       158.7 lb Date of Birth:  January 07, 1971      BSA:          1.873 m Patient Age:    53 years       BP:           161/95 mmHg Patient Gender: M              HR:           73 bpm. Exam Location:  Inpatient Procedure: 2D Echo, Cardiac Doppler and Color Doppler (Both Spectral and Color            Flow Doppler were utilized during procedure). Indications:    Stroke I63.9  History:        Patient has no prior history of Echocardiogram examinations.  Stroke; Risk Factors:Current Smoker.  Sonographer:    Koleen Popper RDCS Referring Phys: [id number] ARY CUMMINS  Sonographer Comments: Image acquisition challenging due to respiratory motion. IMPRESSIONS  1. Left ventricular ejection fraction, by estimation, is 50 to 55%. The left ventricle has low normal function. The left ventricle has no regional wall motion abnormalities. Left ventricular diastolic parameters were normal.  2. Right ventricular systolic function is normal. The right ventricular size is normal.  3. The mitral valve is normal in structure. Trivial mitral valve regurgitation. No evidence of mitral stenosis.  4. The aortic valve is normal in structure. Aortic valve regurgitation is not visualized. No aortic stenosis is present.  5. The inferior vena cava is normal in size with greater than 50%  respiratory variability, suggesting right atrial pressure of 3 mmHg. Conclusion(s)/Recommendation(s): No intracardiac source of embolism detected on this transthoracic study. Consider a transesophageal echocardiogram to exclude cardiac source of embolism if clinically indicated. FINDINGS  Left Ventricle: Left ventricular ejection fraction, by estimation, is 50 to 55%. The left ventricle has low normal function. The left ventricle has no regional wall motion abnormalities. The left ventricular internal cavity size was normal in size. There is no left ventricular hypertrophy. Left ventricular diastolic parameters were normal. Right Ventricle: The right ventricular size is normal. No increase in right ventricular wall thickness. Right ventricular systolic function is normal. Left Atrium: Left atrial size was normal in size. Right Atrium: Right atrial size was normal in size. Pericardium: There is no evidence of pericardial effusion. Mitral Valve: The mitral valve is normal in structure. Trivial mitral valve regurgitation. No evidence of mitral valve stenosis. Tricuspid Valve: The tricuspid valve is normal in structure. Tricuspid valve regurgitation is not demonstrated. No evidence of tricuspid stenosis. Aortic Valve: The aortic valve is normal in structure. Aortic valve regurgitation is not visualized. No aortic stenosis is present. Pulmonic Valve: The pulmonic valve was grossly normal. Pulmonic valve regurgitation is not visualized. No evidence of pulmonic stenosis. Aorta: The aortic root and ascending aorta are structurally normal, with no evidence of dilitation. Venous: The inferior vena cava is normal in size with greater than 50% respiratory variability, suggesting right atrial pressure of 3 mmHg. IAS/Shunts: No atrial level shunt detected by color flow Doppler.  LEFT VENTRICLE PLAX 2D LVIDd:         4.70 cm     Diastology LVIDs:         3.50 cm     LV e' medial:    12.40 cm/s LV PW:         0.60 cm     LV E/e'  medial:  6.0 LV IVS:        0.80 cm     LV e' lateral:   9.46 cm/s LVOT diam:     1.76 cm     LV E/e' lateral: 7.9 LV SV:         43 LV SV Index:   23 LVOT Area:     2.43 cm  LV Volumes (MOD) LV vol d, MOD A2C: 91.2 ml LV vol s, MOD A2C: 42.1 ml LV SV MOD A2C:     49.1 ml RIGHT VENTRICLE             IVC RV Basal diam:  3.56 cm     IVC diam: 1.50 cm RV S prime:     10.00 cm/s TAPSE (M-mode): 1.6 cm LEFT ATRIUM             Index  RIGHT ATRIUM           Index LA diam:        2.35 cm 1.25 cm/m   RA Area:     10.60 cm LA Vol (A2C):   24.4 ml 13.03 ml/m  RA Volume:   24.00 ml  12.82 ml/m LA Vol (A4C):   30.0 ml 16.02 ml/m LA Biplane Vol: 29.1 ml 15.54 ml/m  AORTIC VALVE LVOT Vmax:   84.60 cm/s LVOT Vmean:  56.200 cm/s LVOT VTI:    0.178 m  AORTA Ao Root diam: 3.09 cm Ao Asc diam:  3.07 cm MITRAL VALVE MV Area (PHT): 3.77 cm    SHUNTS MV Decel Time: 201 msec    Systemic VTI:  0.18 m MV E velocity: 74.30 cm/s  Systemic Diam: 1.76 cm MV A velocity: 65.70 cm/s MV E/A ratio:  1.13 Mihai Croitoru MD Electronically signed by Jerel Balding MD Signature Date/Time: 11/28/2024/5:43:55 PM    Final    CT ANGIO HEAD NECK W WO CM (CODE STROKE) Result Date: 11/28/2024 EXAM: CTA HEAD AND NECK WITH AND WITHOUT 11/28/2024 03:24:43 PM TECHNIQUE: CTA of the head and neck was performed with and without the administration of intravenous contrast. 75 mL of iohexol  (OMNIPAQUE ) 350 MG/ML injection was administered. Multiplanar 2D and/or 3D reformatted images are provided for review. Automated exposure control, iterative reconstruction, and/or weight based adjustment of the mA/kV was utilized to reduce the radiation dose to as low as reasonably achievable. Stenosis of the internal carotid arteries measured using NASCET criteria. COMPARISON: None available CLINICAL HISTORY: Neuro deficit, acute, stroke suspected. Sudden onset of left sided tingling. FINDINGS: CTA NECK: AORTIC ARCH AND ARCH VESSELS: No dissection or arterial injury.  No significant stenosis of the brachiocephalic or subclavian arteries. CERVICAL CAROTID ARTERIES: Small amount of calcified and soft plaque at the left carotid bifurcation. No significant stenosis by NASCET criteria. No dissection or arterial injury. CERVICAL VERTEBRAL ARTERIES: Strongly dominant left vertebral artery. No dissection, arterial injury, or significant stenosis. LUNGS AND MEDIASTINUM: Mild biapical lung scarring. SOFT TISSUES: No acute abnormality. BONES: Age advanced cervical spondylosis, greatest at C5-C6 where a broad based posterior disc osteophyte complex results in at least mild spinal stenosis and advanced bilateral neural foraminal stenosis. CTA HEAD: ANTERIOR CIRCULATION: The intracranial internal carotid arteries are widely patent. ACAs and MCAs are patent without evidence of a proximal branch occlusion or significant proximal stenosis. No aneurysm. POSTERIOR CIRCULATION: The intracranial vertebral arteries are widely patent to the basilar. Patent PICA, AICA, and SCA origins are visualized bilaterally. The basilar artery is widely patent. There are small left and diminutive or absent right posterior communicating arteries. Both PCAs are patent without evidence of a significant proximal stenosis. No aneurysm. OTHER: No dural venous sinus thrombosis on this non-dedicated study. IMPRESSION: 1. No large vessel occlusion, hemodynamically significant stenosis, or aneurysm in the head or neck. 2. These results were communicated to Dr. DOROTHA Cummins at 3:29 PM on 11/28/2024 by secure text page via the [facility] messaging system. Electronically signed by: Dasie Hamburg MD 11/28/2024 03:38 PM EST RP Workstation: [id number]   CT HEAD CODE STROKE WO CONTRAST Result Date: 11/28/2024 EXAM: CT HEAD WITHOUT CONTRAST 11/28/2024 03:23:34 PM TECHNIQUE: CT of the head was performed without the administration of intravenous contrast. Automated exposure control, iterative reconstruction, and/or weight based adjustment of the  mA/kV was utilized to reduce the radiation dose to as low as reasonably achievable. COMPARISON: None available. CLINICAL HISTORY: Neuro deficit, acute, stroke suspected. Sudden onset of  left sided tingling. FINDINGS: BRAIN AND VENTRICLES: There is no evidence of an acute infarct, intracranial hemorrhage, mass, midline shift, hydrocephalus, or extra-axial fluid collection. Cerebral volume is normal. Patchy hypodensities in the cerebral white matter bilaterally are nonspecific but compatible with mild to moderate chronic small vessel ischemic disease. ORBITS: Remote medial left orbital fracture. SINUSES: Mild mucosal thickening in the ethmoid sinuses. Clear mastoid air cells. SOFT TISSUES AND SKULL: No acute soft tissue abnormality. No skull fracture. [place] Stroke Program Early CT Score (ASPECTS) ----- Ganglionic (caudate, IC, lentiform nucleus, insula, M1-M3): 7 Supraganglionic (M4-M6): 3 Total: 10 IMPRESSION: 1. No acute intracranial abnormality. ASPECTS of 10. 2. Mild to moderate chronic small vessel ischemic disease. 3. These results were communicated to Dr. Jerri at 3:29 pm on 11/28/2024 by secure text page via the [facility] messaging system. Electronically signed by: Dasie Hamburg MD 11/28/2024 03:30 PM EST RP Workstation: [id number]    Vitals:   11/28/24 2049 11/29/24 0042 11/29/24 0410 11/29/24 0746  BP: 121/88 119/85 (!) 141/92 (!) 142/96  Pulse: 81 82 68 82  Resp: 16     Temp: 98.4 F (36.9 C) 98.2 F (36.8 C) 97.7 F (36.5 C) 98.2 F (36.8 C)  TempSrc: Oral  Oral Oral  SpO2: 98% 97% 97% 97%  Weight:      Height:         PHYSICAL EXAM General:  Alert, well-nourished, well-developed patient in no acute distress Psych:  Mood and affect appropriate for situation CV: Regular rate and rhythm on monitor Respiratory:  Regular, unlabored respirations on room air GI: Abdomen soft and nontender   NEURO:  Mental Status: AA&Ox3, patient is able to give clear and coherent history Speech/Language:  speech is without dysarthria or aphasia.  Naming, repetition, fluency, and comprehension intact.  Cranial Nerves:  II: PERRL. Visual fields full.  III, IV, VI: EOMI. Eyelids elevate symmetrically.  V: Sensation is intact to light touch and symmetrical to face.  VII: Face is symmetrical resting and smiling VIII: hearing intact to voice. IX, X: Palate elevates symmetrically. Phonation is normal.  KP:Dynloizm shrug 5/5. XII: tongue is midline without fasciculations. Motor: 5/5 strength to all muscle groups tested.  Tone: is normal and bulk is normal Sensation- Intact to light touch bilaterally. Extinction absent to light touch to DSS.   Coordination: FTN intact bilaterally, HKS: no ataxia in BLE.No drift.  Gait- deferred  Most Recent NIH 0   ASSESSMENT/PLAN  Mr. HERSH MINNEY is a 54 y.o. male with history of GERD and smoking who presents with acute onset right-sided numbness. Patient states that he was at work in his usual state of health and suddenly felt numbness in his right arm leg and face NIH on Admission 1  TIA: Left hemisphere likely due to small vessel disease Code Stroke CT head No acute abnormality. Small vessel disease. ASPECTS 10.    CTA head & neck No LVO  MRI  no acute process. Moderate chronic microvascular ischemic changes.  2D Echo EF 50-55%.  LDL 158 HgbA1c 5.4 VTE prophylaxis -SCDs No antithrombotic prior to admission, now on aspirin  81 mg daily and clopidogrel  75 mg daily for 3 weeks and then ASA alone. Therapy recommendations:  Pending Disposition: Pending  Hypertension Home meds: None Stable-slightly elevated BP goal normotensive  Hyperlipidemia Home meds: None LDL 158, goal < 70 Add atorvastatin  40 mg Continue statin at discharge  Tobacco Abuse Patient smokes 1 pack/day of cigarettes        Ready to quit? Yes  Nicotine  replacement therapy provided   Dysphagia Patient has post-stroke dysphagia, SLP consulted    Diet   Diet Heart Room  service appropriate? Yes; Fluid consistency: Thin   Advance diet as tolerated  Other Stroke Risk Factors ETOH use, alcohol level <15, advised to drink no more than 2 drink(s) a day    Hospital day # 0   Karna Geralds DNP, ACNPC-AG  [facility]  I have personally obtained history,examined this patient, reviewed notes, independently viewed imaging studies, participated in medical decision making and plan of care.ROS completed by me personally and pertinent positives fully documented  I have made any additions or clarifications directly to the above note. Agree with note above.  Patient presented with transient right body paresthesias likely left hemispheric TIA from small vessel disease.  Recommend aspirin  Plavix  for 3 weeks followed by aspirin  alone and aggressive risk factor modification.  Increase home dose of statin as LDL cholesterol is suboptimally controlled.  Likely discharge home later today.  Follow-up in outpatient stroke clinic with nurse practitioner in 2 months discussed with Dr. Joette.   I personally spent a total of 50 minutes in the care of the patient today including getting/reviewing separately obtained history, performing a medically appropriate exam/evaluation, counseling and educating, placing orders, referring and communicating with other health care professionals, documenting clinical information in the EHR, independently interpreting results, and coordinating care.         Eather Popp, MD Medical Director [facility] Pager: [phone] 11/29/2024 4:59 PM   To contact Stroke Continuity provider, please refer to [email or web address]. After hours, contact General Neurology

## 2024-11-29 NOTE — Discharge Summary (Signed)
 " [facility]  Physician Discharge Summary   Patient ID: Isaac Steele MRN: [id number] DOB/AGE: 1970-11-29 53 y.o.  Admit date: 11/28/2024 Discharge date: 11/29/2024    PCP: Isaac Yvonna SQUIBB, PA-C  DISCHARGE DIAGNOSES:    TIA (transient ischemic attack) Hyperlipidemia   RECOMMENDATIONS FOR OUTPATIENT FOLLOW UP: Ambulatory referrals to neurology Follow-up with PCP for further management of elevated blood pressure   Home Health: None Equipment/Devices: None  CODE STATUS: Full code  DISCHARGE CONDITION: fair  Diet recommendation: Heart healthy  INITIAL HISTORY: 54 y.o. male with medical history significant for tobacco abuse who presented from his place of employment with right-sided weakness.  He is an journalist, newspaper.  Initially he was seen as a code stroke but his symptoms resolved rapidly.  He was hospitalized for further management.     Consultants: Neurology   Procedures: Echocardiogram  HOSPITAL COURSE:   TIA Patient presented with transient right-sided weakness. MRI brain does not show any acute findings. CT angiogram does not show any large vessel occlusion. HbA1c 5.4. LDL is 158.  He will be started on statin. Seen by PT and OT.  No deficits identified. Echocardiogram shows normal LVEF.  No significant valvular abnormalities. Aspirin  and Plavix  for 3 weeks followed by aspirin  alone Cleared by neurology.   Elevated blood pressure No previous history of hypertension.  Blood pressures have stabilized though still has occasional high readings.  Patient does have a PCP.  He will follow-up with PCP to determine need for antihypertensives.   Tobacco abuse Has been counseled.     Patient is stable.  Okay for discharge home today.   PERTINENT LABS:  The results of significant diagnostics from this hospitalization (including imaging, microbiology, ancillary and laboratory) are listed below for reference.     Labs:   Basic Metabolic  Panel: Recent Labs  Lab 11/28/24 1505 11/28/24 1509 11/29/24 0143  NA 136 137 135  K 4.2 4.1 3.5  CL 101 102 101  CO2 25  --  24  GLUCOSE 101* 101* 95  BUN 11 12 14   CREATININE 1.08 1.10 1.09  CALCIUM  9.2  --  9.1   Liver Function Tests: Recent Labs  Lab 11/28/24 1505 11/29/24 0143  AST 29 23  ALT 15 16  ALKPHOS 59 55  BILITOT 0.5 0.7  PROT 7.4 6.6  ALBUMIN 4.5 4.1   CBC: Recent Labs  Lab 11/28/24 1505 11/28/24 1509 11/29/24 0143  WBC 11.6*  --  9.5  NEUTROABS 7.2  --   --   HGB 15.6 16.0 15.3  HCT 44.9 47.0 43.2  MCV 93.7  --  91.7  PLT 350  --  340    CBG: Recent Labs  Lab 11/28/24 1504  GLUCAP 114*     IMAGING STUDIES MR BRAIN WO CONTRAST Result Date: 11/28/2024 EXAM: MRI BRAIN WITHOUT CONTRAST 11/28/2024 06:15:35 PM TECHNIQUE: Multiplanar multisequence MRI of the head/brain was performed without the administration of intravenous contrast. COMPARISON: CT head and CTA head and neck same day. CLINICAL HISTORY: Neuro deficit, acute, stroke suspected. FINDINGS: BRAIN AND VENTRICLES: No acute infarct. No intracranial hemorrhage. No mass. No midline shift. No hydrocephalus. The sella is unremarkable. Normal flow voids. There are scattered and confluent areas of T2 and FLAIR hyperintensity throughout the periventricular and subcortical white matter suggestive of moderate chronic microvascular ischemic changes. 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 SINUSES AND MASTOIDS: Mucosal thickening in the ethmoid sinus. Nasal septal defect measuring up to 2.7 cm in AP dimension.  Trace fluid in the mastoid air cells. BONES AND SOFT TISSUES: Normal marrow signal. No soft tissue abnormality. IMPRESSION: 1. No acute findings. 2. Moderate chronic microvascular ischemic changes. Electronically signed by: Isaac Mania MD 11/28/2024 07:30 PM EST RP Workstation: [id number]   ECHOCARDIOGRAM COMPLETE Result Date: 11/28/2024    ECHOCARDIOGRAM REPORT   Patient Name:   Isaac Steele Date of Exam: 11/28/2024 Medical Rec #:  [id number]      Height:       69.0 in Accession #:    [id number]     Weight:       158.7 lb Date of Birth:  1971-08-01      BSA:          1.873 m Patient Age:    53 years       BP:           161/95 mmHg Patient Gender: M              HR:           73 bpm. Exam Location:  Inpatient Procedure: 2D Echo, Cardiac Doppler and Color Doppler (Both Spectral and Color            Flow Doppler were utilized during procedure). Indications:    Stroke I63.9  History:        Patient has no prior history of Echocardiogram examinations.                 Stroke; Risk Factors:Current Smoker.  Sonographer:    Koleen Popper RDCS Referring Phys: [id number] ARY CUMMINS  Sonographer Comments: Image acquisition challenging due to respiratory motion. IMPRESSIONS  1. Left ventricular ejection fraction, by estimation, is 50 to 55%. The left ventricle has low normal function. The left ventricle has no regional wall motion abnormalities. Left ventricular diastolic parameters were normal.  2. Right ventricular systolic function is normal. The right ventricular size is normal.  3. The mitral valve is normal in structure. Trivial mitral valve regurgitation. No evidence of mitral stenosis.  4. The aortic valve is normal in structure. Aortic valve regurgitation is not visualized. No aortic stenosis is present.  5. The inferior vena cava is normal in size with greater than 50% respiratory variability, suggesting right atrial pressure of 3 mmHg. Conclusion(s)/Recommendation(s): No intracardiac source of embolism detected on this transthoracic study. Consider a transesophageal echocardiogram to exclude cardiac source of embolism if clinically indicated. FINDINGS  Left Ventricle: Left ventricular ejection fraction, by estimation, is 50 to 55%. The left ventricle has low normal function. The left ventricle has no regional wall motion abnormalities. The left ventricular internal cavity size was normal in size.  There is no left ventricular hypertrophy. Left ventricular diastolic parameters were normal. Right Ventricle: The right ventricular size is normal. No increase in right ventricular wall thickness. Right ventricular systolic function is normal. Left Atrium: Left atrial size was normal in size. Right Atrium: Right atrial size was normal in size. Pericardium: There is no evidence of pericardial effusion. Mitral Valve: The mitral valve is normal in structure. Trivial mitral valve regurgitation. No evidence of mitral valve stenosis. Tricuspid Valve: The tricuspid valve is normal in structure. Tricuspid valve regurgitation is not demonstrated. No evidence of tricuspid stenosis. Aortic Valve: The aortic valve is normal in structure. Aortic valve regurgitation is not visualized. No aortic stenosis is present. Pulmonic Valve: The pulmonic valve was grossly normal. Pulmonic valve regurgitation is not visualized. No evidence of pulmonic stenosis. Aorta: The aortic root and ascending  aorta are structurally normal, with no evidence of dilitation. Venous: The inferior vena cava is normal in size with greater than 50% respiratory variability, suggesting right atrial pressure of 3 mmHg. IAS/Shunts: No atrial level shunt detected by color flow Doppler.  LEFT VENTRICLE PLAX 2D LVIDd:         4.70 cm     Diastology LVIDs:         3.50 cm     LV e' medial:    12.40 cm/s LV PW:         0.60 cm     LV E/e' medial:  6.0 LV IVS:        0.80 cm     LV e' lateral:   9.46 cm/s LVOT diam:     1.76 cm     LV E/e' lateral: 7.9 LV SV:         43 LV SV Index:   23 LVOT Area:     2.43 cm  LV Volumes (MOD) LV vol d, MOD A2C: 91.2 ml LV vol s, MOD A2C: 42.1 ml LV SV MOD A2C:     49.1 ml RIGHT VENTRICLE             IVC RV Basal diam:  3.56 cm     IVC diam: 1.50 cm RV S prime:     10.00 cm/s TAPSE (M-mode): 1.6 cm LEFT ATRIUM             Index        RIGHT ATRIUM           Index LA diam:        2.35 cm 1.25 cm/m   RA Area:     10.60 cm LA Vol (A2C):    24.4 ml 13.03 ml/m  RA Volume:   24.00 ml  12.82 ml/m LA Vol (A4C):   30.0 ml 16.02 ml/m LA Biplane Vol: 29.1 ml 15.54 ml/m  AORTIC VALVE LVOT Vmax:   84.60 cm/s LVOT Vmean:  56.200 cm/s LVOT VTI:    0.178 m  AORTA Ao Root diam: 3.09 cm Ao Asc diam:  3.07 cm MITRAL VALVE MV Area (PHT): 3.77 cm    SHUNTS MV Decel Time: 201 msec    Systemic VTI:  0.18 m MV E velocity: 74.30 cm/s  Systemic Diam: 1.76 cm MV A velocity: 65.70 cm/s MV E/A ratio:  1.13 Mihai Croitoru MD Electronically signed by Jerel Balding MD Signature Date/Time: 11/28/2024/5:43:55 PM    Final    CT ANGIO HEAD NECK W WO CM (CODE STROKE) Result Date: 11/28/2024 EXAM: CTA HEAD AND NECK WITH AND WITHOUT 11/28/2024 03:24:43 PM TECHNIQUE: CTA of the head and neck was performed with and without the administration of intravenous contrast. 75 mL of iohexol  (OMNIPAQUE ) 350 MG/ML injection was administered. Multiplanar 2D and/or 3D reformatted images are provided for review. Automated exposure control, iterative reconstruction, and/or weight based adjustment of the mA/kV was utilized to reduce the radiation dose to as low as reasonably achievable. Stenosis of the internal carotid arteries measured using NASCET criteria. COMPARISON: None available CLINICAL HISTORY: Neuro deficit, acute, stroke suspected. Sudden onset of left sided tingling. FINDINGS: CTA NECK: AORTIC ARCH AND ARCH VESSELS: No dissection or arterial injury. No significant stenosis of the brachiocephalic or subclavian arteries. CERVICAL CAROTID ARTERIES: Small amount of calcified and soft plaque at the left carotid bifurcation. No significant stenosis by NASCET criteria. No dissection or arterial injury. CERVICAL VERTEBRAL ARTERIES: Strongly dominant left vertebral artery. No dissection,  arterial injury, or significant stenosis. LUNGS AND MEDIASTINUM: Mild biapical lung scarring. SOFT TISSUES: No acute abnormality. BONES: Age advanced cervical spondylosis, greatest at C5-C6 where a broad  based posterior disc osteophyte complex results in at least mild spinal stenosis and advanced bilateral neural foraminal stenosis. CTA HEAD: ANTERIOR CIRCULATION: The intracranial internal carotid arteries are widely patent. ACAs and MCAs are patent without evidence of a proximal branch occlusion or significant proximal stenosis. No aneurysm. POSTERIOR CIRCULATION: The intracranial vertebral arteries are widely patent to the basilar. Patent PICA, AICA, and SCA origins are visualized bilaterally. The basilar artery is widely patent. There are small left and diminutive or absent right posterior communicating arteries. Both PCAs are patent without evidence of a significant proximal stenosis. No aneurysm. OTHER: No dural venous sinus thrombosis on this non-dedicated study. IMPRESSION: 1. No large vessel occlusion, hemodynamically significant stenosis, or aneurysm in the head or neck. 2. These results were communicated to Dr. DOROTHA Cummins at 3:29 PM on 11/28/2024 by secure text page via the [facility] messaging system. Electronically signed by: Dasie Hamburg MD 11/28/2024 03:38 PM EST RP Workstation: [id number]   CT HEAD CODE STROKE WO CONTRAST Result Date: 11/28/2024 EXAM: CT HEAD WITHOUT CONTRAST 11/28/2024 03:23:34 PM TECHNIQUE: CT of the head was performed without the administration of intravenous contrast. Automated exposure control, iterative reconstruction, and/or weight based adjustment of the mA/kV was utilized to reduce the radiation dose to as low as reasonably achievable. COMPARISON: None available. CLINICAL HISTORY: Neuro deficit, acute, stroke suspected. Sudden onset of left sided tingling. FINDINGS: BRAIN AND VENTRICLES: There is no evidence of an acute infarct, intracranial hemorrhage, mass, midline shift, hydrocephalus, or extra-axial fluid collection. Cerebral volume is normal. Patchy hypodensities in the cerebral white matter bilaterally are nonspecific but compatible with mild to moderate chronic small vessel  ischemic disease. ORBITS: Remote medial left orbital fracture. SINUSES: Mild mucosal thickening in the ethmoid sinuses. Clear mastoid air cells. SOFT TISSUES AND SKULL: No acute soft tissue abnormality. No skull fracture. [place] Stroke Program Early CT Score (ASPECTS) ----- Ganglionic (caudate, IC, lentiform nucleus, insula, M1-M3): 7 Supraganglionic (M4-M6): 3 Total: 10 IMPRESSION: 1. No acute intracranial abnormality. ASPECTS of 10. 2. Mild to moderate chronic small vessel ischemic disease. 3. These results were communicated to Dr. Cummins at 3:29 pm on 11/28/2024 by secure text page via the [facility] messaging system. Electronically signed by: Dasie Hamburg MD 11/28/2024 03:30 PM EST RP Workstation: [id number]    DISCHARGE EXAMINATION: See progress note from earlier today  DISPOSITION: Home  Discharge Instructions     Ambulatory referral to Neurology   Complete by: As directed    An appointment is requested in approximately: 8 weeks for TIA   Call MD for:  difficulty breathing, headache or visual disturbances   Complete by: As directed    Call MD for:  extreme fatigue   Complete by: As directed    Call MD for:  persistant dizziness or light-headedness   Complete by: As directed    Call MD for:  persistant nausea and vomiting   Complete by: As directed    Call MD for:  severe uncontrolled pain   Complete by: As directed    Call MD for:  temperature >100.4   Complete by: As directed    Diet - low sodium heart healthy   Complete by: As directed    Discharge instructions   Complete by: As directed    Please be sure to follow-up with your primary care provider for further management  of your elevated blood pressures.  Referral will be sent to neurology for follow-up.  Take your medications as prescribed.  You were cared for by a hospitalist during your hospital stay. If you have any questions about your discharge medications or the care you received while you were in the hospital after you are  discharged, you can call the unit and asked to speak with the hospitalist on call if the hospitalist that took care of you is not available. Once you are discharged, your primary care physician will handle any further medical issues. Please note that NO REFILLS for any discharge medications will be authorized once you are discharged, as it is imperative that you return to your primary care physician (or establish a relationship with a primary care physician if you do not have one) for your aftercare needs so that they can reassess your need for medications and monitor your lab values. If you do not have a primary care physician, you can call [phone] for a physician referral.   Increase activity slowly   Complete by: As directed         Allergies as of 11/29/2024   No Known Allergies      Medication List     STOP taking these medications    amoxicillin -clavulanate 875-125 MG tablet Commonly known as: AUGMENTIN    famotidine 20 MG tablet Commonly known as: PEPCID   [employer] Extra Strength 500-325-65 MG Pack Generic drug: Aspirin -Acetaminophen -Caffeine   ibuprofen  200 MG tablet Commonly known as: ADVIL        TAKE these medications    aspirin  EC 81 MG tablet Take 1 tablet (81 mg total) by mouth daily. Swallow whole.   atorvastatin  40 MG tablet Commonly known as: LIPITOR Take 1 tablet (40 mg total) by mouth daily.   clopidogrel  75 MG tablet Commonly known as: PLAVIX  Take 1 tablet (75 mg total) by mouth daily for 21 days. For 3 weeks only   pantoprazole  40 MG tablet Commonly known as: PROTONIX  Take 40 mg by mouth 2 (two) times daily.   sucralfate  1 g tablet Commonly known as: CARAFATE  Take 1 g by mouth 4 (four) times daily.           TOTAL DISCHARGE TIME: 35 minutes  Vikrant Pryce [employer] on [email or web address]  11/30/2024, 10:28 AM    "

## 2024-11-29 NOTE — TOC CAGE-AID Note (Signed)
 Transition of Care [facility]) - CAGE-AID Screening   Patient Details  Name: Isaac Steele MRN: [id number] Date of Birth: 1971/01/21  [facility] CM/SW Contact:    Almarie CHRISTELLA Goodie, LCSW Phone Number: 11/29/2024, 3:18 PM   Clinical Narrative:    CAGE-AID Screening:    Have You Ever Felt You Ought to Cut Down on Your Drinking or Drug Use?: Yes Have People Annoyed You By [employer] Your Drinking Or Drug Use?: No Have You Felt Bad Or Guilty About Your Drinking Or Drug Use?: No Have You Ever Had a Drink or Used Drugs First Thing In The Morning to Steady Your Nerves or to Get Rid of a Hangover?: No CAGE-AID Score: 1  Substance Abuse Education Offered: No

## 2024-11-29 NOTE — Plan of Care (Signed)
   Problem: Safety: Goal: Ability to remain free from injury will improve Outcome: Progressing

## 2024-11-29 NOTE — Evaluation (Signed)
 Occupational Therapy Evaluation Patient Details Name: Isaac Steele MRN: [id number] DOB: June 17, 1971 Today's Date: 11/29/2024   History of Present Illness   Pt is a 54 y.o. male presenting 1/27 after episode of R sided numbness while at work. MRI brain negative. Found to have elevated BP. PMH: tobacco use, GERD     Clinical Impressions Pt admitted based on above, and was seen based on problem list below. PTA pt was independent with ADLs and IADLs. Today pt is at his baseline for ADLs and mobility. Pt completed toilet hygiene, LB dressing, ambulated 351ft, and negotiated 4 steps ind. Pt with brief episode of tachycardia with mobility, HR 150 bpm, however recovered quickly with brief standing rest break. Strength, sensation, vision, and coordination are intact, and per pt at baseline. Pt reporting no ADL or mobility concerns upon d/c, no follow up OT or DME needs. OT is signing off on this pt.     If plan is discharge home, recommend the following:   Assist for transportation     Functional Status Assessment   Patient has not had a recent decline in their functional status     Equipment Recommendations   None recommended by OT      Precautions/Restrictions   Precautions Precautions: Fall Recall of Precautions/Restrictions: Intact Restrictions Weight Bearing Restrictions Per Provider Order: No     Mobility Bed Mobility Overal bed mobility: Modified Independent             General bed mobility comments: HOB elevated    Transfers Overall transfer level: Independent Equipment used: None               General transfer comment: Pt ambulated 363ft and negotiated 4 steps with step through pattern, no AD      Balance Overall balance assessment: No apparent balance deficits (not formally assessed)       ADL either performed or assessed with clinical judgement   ADL Overall ADL's : Independent;At baseline   General ADL Comments: Pt completed LB  dressing, toilet hygiene and functional transfers ind     Vision Baseline Vision/History: 1 Wears glasses Patient Visual Report: No change from baseline Vision Assessment?: No apparent visual deficits;Wears glasses for reading Additional Comments: Vision screen completed [facility]     Perception Perception: Within Functional Limits       Praxis Praxis: WFL       Pertinent Vitals/Pain Pain Assessment Pain Assessment: No/denies pain     Extremity/Trunk Assessment Upper Extremity Assessment Upper Extremity Assessment: Overall WFL for tasks assessed   Lower Extremity Assessment Lower Extremity Assessment: Overall WFL for tasks assessed   Cervical / Trunk Assessment Cervical / Trunk Assessment: Normal   Communication Communication Communication: No apparent difficulties   Cognition Arousal: Alert Behavior During Therapy: WFL for tasks assessed/performed Cognition: No apparent impairments     Following commands: Intact       Cueing  General Comments   Cueing Techniques: Verbal cues  Brief episode of tachy cardic, HR 150 bpm. Recovered to low 100s with brief standing rest break           Home Living Family/patient expects to be discharged to:: Private residence Living Arrangements: Spouse/significant other Available Help at Discharge: Family;Available PRN/intermittently Type of Home: Mobile home Home Access: Stairs to enter Entrance Stairs-Number of Steps: 4 Entrance Stairs-Rails: Can reach both Home Layout: One level     Bathroom Shower/Tub: Walk-in shower;Tub/shower unit   [employer]: Standard     Home Equipment: Grab bars -  tub/shower          Prior Functioning/Environment Prior Level of Function : Independent/Modified Independent       Mobility Comments: Ind, No AD ADLs Comments: ind, works as a Patent Examiner Problem List: Decreased strength        OT Goals(Current goals can be found in the care plan section)   Acute Rehab OT  Goals Patient Stated Goal: To go home OT Goal Formulation: All assessment and education complete, DC therapy Time For Goal Achievement: 12/13/24 Potential to Achieve Goals: Good   AM-PAC OT 6 Clicks Daily Activity     Outcome Measure Help from another person eating meals?: None Help from another person taking care of personal grooming?: None Help from another person toileting, which includes using toliet, bedpan, or urinal?: None Help from another person bathing (including washing, rinsing, drying)?: None Help from another person to put on and taking off regular upper body clothing?: None Help from another person to put on and taking off regular lower body clothing?: None 6 Click Score: 24   End of Session Equipment Utilized During Treatment: Gait belt Nurse Communication: Mobility status  Activity Tolerance: Patient tolerated treatment well Patient left: in bed;with call bell/phone within reach  OT Visit Diagnosis: Unsteadiness on feet (R26.81);Muscle weakness (generalized) (M62.81)                Time: 9254-9198 OT Time Calculation (min): 16 min Charges:  OT General Charges $OT Visit: 1 Visit OT Evaluation $OT Eval Low Complexity: 1 Low  Adrianne BROCKS, OT  Acute Rehabilitation Services Office [phone] Secure chat preferred   Adrianne GORMAN [employer] 11/29/2024, 9:03 AM

## 2024-11-29 NOTE — Progress Notes (Signed)
 DC paperwork, med list, stroke booklet, follow up care reviewed with pt and SO who verbalized understanding.  IV and tele removed.  Pt to follow up with PCP and neurology.

## 2024-11-29 NOTE — Progress Notes (Signed)
 "  [facility] PROGRESS NOTE   Isaac Steele FMW:[id number] DOB: 1971/03/11 DOA: 11/28/2024  PCP: Isaac Yvonna SQUIBB, PA-C  Brief History: 54 y.o. male with medical history significant for tobacco abuse who presented from his place of employment with right-sided weakness.  He is an journalist, newspaper.  Initially he was seen as a code stroke but his symptoms resolved rapidly.  He was hospitalized for further management.    Consultants: Neurology  Procedures: Echocardiogram    Subjective/Interval History: Patient feels well.  Denies any weakness on the right side.  Denies any headaches.  Denies any chronic health issues up until now.    Assessment/Plan:  TIA Patient presented with transient right-sided weakness. MRI brain does not show any acute findings. CT angiogram does not show any large vessel occlusion. HbA1c 5.4. LDL is 158.  He will be started on statin. Seen by PT and OT.  No deficits identified. Echocardiogram shows normal LVEF.  No significant valvular abnormalities. Currently on aspirin  and Plavix . Await neurology input today.  Elevated blood pressure No previous history of hypertension.  Blood pressures have stabilized though still has occasional high readings.  Patient does have a PCP.  He will follow-up with PCP to determine need for antihypertensives.  Tobacco abuse Has been counseled.   DVT Prophylaxis: Lovenox  Code Status: Full code Family Communication: Discussed with patient Disposition Plan: Home when cleared by neurology  Status is: Observation The patient remains OBS appropriate and will d/c before 2 midnights.      Medications: Scheduled:  aspirin  EC  81 mg Oral Daily   clopidogrel   75 mg Oral Daily   enoxaparin  (LOVENOX ) injection  40 mg Subcutaneous Q24H   nicotine   14 mg Transdermal Daily   pantoprazole   40 mg Oral BID   sodium chloride  flush  3 mL Intravenous Once   sucralfate   1 g Oral QID   Continuous: PRN:acetaminophen   **OR** acetaminophen  (TYLENOL ) oral liquid 160 mg/5 mL **OR** acetaminophen , senna-docusate   Objective:  Vital Signs  Vitals:   11/28/24 2049 11/29/24 0042 11/29/24 0410 11/29/24 0746  BP: 121/88 119/85 (!) 141/92 (!) 142/96  Pulse: 81 82 68 82  Resp: 16     Temp: 98.4 F (36.9 C) 98.2 F (36.8 C) 97.7 F (36.5 C) 98.2 F (36.8 C)  TempSrc: Oral  Oral Oral  SpO2: 98% 97% 97% 97%  Weight:      Height:        Intake/Output Summary (Last 24 hours) at 11/29/2024 1026 Last data filed at 11/28/2024 2303 Gross per 24 hour  Intake 240 ml  Output --  Net 240 ml   Filed Weights   11/28/24 1456 11/28/24 1500  Weight: 66.7 kg 72 kg    General appearance: Awake alert.  In no distress Resp: Clear to auscultation bilaterally.  Normal effort Cardio: S1-S2 is normal regular.  No S3-S4.  No rubs murmurs or bruit GI: Abdomen is soft.  Nontender nondistended.  Bowel sounds are present normal.  No masses organomegaly Extremities: No edema.  Full range of motion of lower extremities. Neurologic: Alert and oriented x3.  No focal neurological deficits.    Lab Results:  Data Reviewed: I have personally reviewed following labs and reports of the imaging studies  CBC: Recent Labs  Lab 11/28/24 1505 11/28/24 1509 11/29/24 0143  WBC 11.6*  --  9.5  NEUTROABS 7.2  --   --   HGB 15.6 16.0 15.3  HCT 44.9 47.0 43.2  MCV 93.7  --  91.7  PLT 350  --  340    Basic Metabolic Panel: Recent Labs  Lab 11/28/24 1505 11/28/24 1509 11/29/24 0143  NA 136 137 135  K 4.2 4.1 3.5  CL 101 102 101  CO2 25  --  24  GLUCOSE 101* 101* 95  BUN 11 12 14   CREATININE 1.08 1.10 1.09  CALCIUM  9.2  --  9.1    GFR: Estimated Creatinine Clearance: 79.3 mL/min (by C-G formula based on SCr of 1.09 mg/dL).  Liver Function Tests: Recent Labs  Lab 11/28/24 1505 11/29/24 0143  AST 29 23  ALT 15 16  ALKPHOS 59 55  BILITOT 0.5 0.7  PROT 7.4 6.6  ALBUMIN 4.5 4.1    Coagulation Profile: Recent  Labs  Lab 11/28/24 1505  INR 1.0   HbA1C: Recent Labs    11/29/24 0143  HGBA1C 5.4    CBG: Recent Labs  Lab 11/28/24 1504  GLUCAP 114*    Lipid Profile: Recent Labs    11/29/24 0143  CHOL 237*  HDL 49  LDLCALC 158*  TRIG 155*  CHOLHDL 4.9    Radiology Studies: MR BRAIN WO CONTRAST Result Date: 11/28/2024 EXAM: MRI BRAIN WITHOUT CONTRAST 11/28/2024 06:15:35 PM TECHNIQUE: Multiplanar multisequence MRI of the head/brain was performed without the administration of intravenous contrast. COMPARISON: CT head and CTA head and neck same day. CLINICAL HISTORY: Neuro deficit, acute, stroke suspected. FINDINGS: BRAIN AND VENTRICLES: No acute infarct. No intracranial hemorrhage. No mass. No midline shift. No hydrocephalus. The sella is unremarkable. Normal flow voids. There are scattered and confluent areas of T2 and FLAIR hyperintensity throughout the periventricular and subcortical white matter suggestive of moderate chronic microvascular ischemic changes. 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 SINUSES AND MASTOIDS: Mucosal thickening in the ethmoid sinus. Nasal septal defect measuring up to 2.7 cm in AP dimension. Trace fluid in the mastoid air cells. BONES AND SOFT TISSUES: Normal marrow signal. No soft tissue abnormality. IMPRESSION: 1. No acute findings. 2. Moderate chronic microvascular ischemic changes. Electronically signed by: Donnice Mania MD 11/28/2024 07:30 PM EST RP Workstation: [id number]   ECHOCARDIOGRAM COMPLETE Result Date: 11/28/2024    ECHOCARDIOGRAM REPORT   Patient Name:   Isaac Steele Date of Exam: 11/28/2024 Medical Rec #:  [id number]      Height:       69.0 in Accession #:    [id number]     Weight:       158.7 lb Date of Birth:  1971-07-01      BSA:          1.873 m Patient Age:    53 years       BP:           161/95 mmHg Patient Gender: M              HR:           73 bpm. Exam Location:  Inpatient Procedure: 2D Echo, Cardiac Doppler and Color Doppler  (Both Spectral and Color            Flow Doppler were utilized during procedure). Indications:    Stroke I63.9  History:        Patient has no prior history of Echocardiogram examinations.                 Stroke; Risk Factors:Current Smoker.  Sonographer:    Koleen Popper RDCS Referring Phys: [id number] ARY CUMMINS  Sonographer Comments: Image acquisition challenging due to respiratory  motion. IMPRESSIONS  1. Left ventricular ejection fraction, by estimation, is 50 to 55%. The left ventricle has low normal function. The left ventricle has no regional wall motion abnormalities. Left ventricular diastolic parameters were normal.  2. Right ventricular systolic function is normal. The right ventricular size is normal.  3. The mitral valve is normal in structure. Trivial mitral valve regurgitation. No evidence of mitral stenosis.  4. The aortic valve is normal in structure. Aortic valve regurgitation is not visualized. No aortic stenosis is present.  5. The inferior vena cava is normal in size with greater than 50% respiratory variability, suggesting right atrial pressure of 3 mmHg. Conclusion(s)/Recommendation(s): No intracardiac source of embolism detected on this transthoracic study. Consider a transesophageal echocardiogram to exclude cardiac source of embolism if clinically indicated. FINDINGS  Left Ventricle: Left ventricular ejection fraction, by estimation, is 50 to 55%. The left ventricle has low normal function. The left ventricle has no regional wall motion abnormalities. The left ventricular internal cavity size was normal in size. There is no left ventricular hypertrophy. Left ventricular diastolic parameters were normal. Right Ventricle: The right ventricular size is normal. No increase in right ventricular wall thickness. Right ventricular systolic function is normal. Left Atrium: Left atrial size was normal in size. Right Atrium: Right atrial size was normal in size. Pericardium: There is no evidence of  pericardial effusion. Mitral Valve: The mitral valve is normal in structure. Trivial mitral valve regurgitation. No evidence of mitral valve stenosis. Tricuspid Valve: The tricuspid valve is normal in structure. Tricuspid valve regurgitation is not demonstrated. No evidence of tricuspid stenosis. Aortic Valve: The aortic valve is normal in structure. Aortic valve regurgitation is not visualized. No aortic stenosis is present. Pulmonic Valve: The pulmonic valve was grossly normal. Pulmonic valve regurgitation is not visualized. No evidence of pulmonic stenosis. Aorta: The aortic root and ascending aorta are structurally normal, with no evidence of dilitation. Venous: The inferior vena cava is normal in size with greater than 50% respiratory variability, suggesting right atrial pressure of 3 mmHg. IAS/Shunts: No atrial level shunt detected by color flow Doppler.  LEFT VENTRICLE PLAX 2D LVIDd:         4.70 cm     Diastology LVIDs:         3.50 cm     LV e' medial:    12.40 cm/s LV PW:         0.60 cm     LV E/e' medial:  6.0 LV IVS:        0.80 cm     LV e' lateral:   9.46 cm/s LVOT diam:     1.76 cm     LV E/e' lateral: 7.9 LV SV:         43 LV SV Index:   23 LVOT Area:     2.43 cm  LV Volumes (MOD) LV vol d, MOD A2C: 91.2 ml LV vol s, MOD A2C: 42.1 ml LV SV MOD A2C:     49.1 ml RIGHT VENTRICLE             IVC RV Basal diam:  3.56 cm     IVC diam: 1.50 cm RV S prime:     10.00 cm/s TAPSE (M-mode): 1.6 cm LEFT ATRIUM             Index        RIGHT ATRIUM           Index LA diam:  2.35 cm 1.25 cm/m   RA Area:     10.60 cm LA Vol (A2C):   24.4 ml 13.03 ml/m  RA Volume:   24.00 ml  12.82 ml/m LA Vol (A4C):   30.0 ml 16.02 ml/m LA Biplane Vol: 29.1 ml 15.54 ml/m  AORTIC VALVE LVOT Vmax:   84.60 cm/s LVOT Vmean:  56.200 cm/s LVOT VTI:    0.178 m  AORTA Ao Root diam: 3.09 cm Ao Asc diam:  3.07 cm MITRAL VALVE MV Area (PHT): 3.77 cm    SHUNTS MV Decel Time: 201 msec    Systemic VTI:  0.18 m MV E velocity: 74.30  cm/s  Systemic Diam: 1.76 cm MV A velocity: 65.70 cm/s MV E/A ratio:  1.13 Mihai Croitoru MD Electronically signed by Jerel Balding MD Signature Date/Time: 11/28/2024/5:43:55 PM    Final    CT ANGIO HEAD NECK W WO CM (CODE STROKE) Result Date: 11/28/2024 EXAM: CTA HEAD AND NECK WITH AND WITHOUT 11/28/2024 03:24:43 PM TECHNIQUE: CTA of the head and neck was performed with and without the administration of intravenous contrast. 75 mL of iohexol  (OMNIPAQUE ) 350 MG/ML injection was administered. Multiplanar 2D and/or 3D reformatted images are provided for review. Automated exposure control, iterative reconstruction, and/or weight based adjustment of the mA/kV was utilized to reduce the radiation dose to as low as reasonably achievable. Stenosis of the internal carotid arteries measured using NASCET criteria. COMPARISON: None available CLINICAL HISTORY: Neuro deficit, acute, stroke suspected. Sudden onset of left sided tingling. FINDINGS: CTA NECK: AORTIC ARCH AND ARCH VESSELS: No dissection or arterial injury. No significant stenosis of the brachiocephalic or subclavian arteries. CERVICAL CAROTID ARTERIES: Small amount of calcified and soft plaque at the left carotid bifurcation. No significant stenosis by NASCET criteria. No dissection or arterial injury. CERVICAL VERTEBRAL ARTERIES: Strongly dominant left vertebral artery. No dissection, arterial injury, or significant stenosis. LUNGS AND MEDIASTINUM: Mild biapical lung scarring. SOFT TISSUES: No acute abnormality. BONES: Age advanced cervical spondylosis, greatest at C5-C6 where a broad based posterior disc osteophyte complex results in at least mild spinal stenosis and advanced bilateral neural foraminal stenosis. CTA HEAD: ANTERIOR CIRCULATION: The intracranial internal carotid arteries are widely patent. ACAs and MCAs are patent without evidence of a proximal branch occlusion or significant proximal stenosis. No aneurysm. POSTERIOR CIRCULATION: The intracranial  vertebral arteries are widely patent to the basilar. Patent PICA, AICA, and SCA origins are visualized bilaterally. The basilar artery is widely patent. There are small left and diminutive or absent right posterior communicating arteries. Both PCAs are patent without evidence of a significant proximal stenosis. No aneurysm. OTHER: No dural venous sinus thrombosis on this non-dedicated study. IMPRESSION: 1. No large vessel occlusion, hemodynamically significant stenosis, or aneurysm in the head or neck. 2. These results were communicated to Dr. DOROTHA Cummins at 3:29 PM on 11/28/2024 by secure text page via the [facility] messaging system. Electronically signed by: Dasie Hamburg MD 11/28/2024 03:38 PM EST RP Workstation: [id number]   CT HEAD CODE STROKE WO CONTRAST Result Date: 11/28/2024 EXAM: CT HEAD WITHOUT CONTRAST 11/28/2024 03:23:34 PM TECHNIQUE: CT of the head was performed without the administration of intravenous contrast. Automated exposure control, iterative reconstruction, and/or weight based adjustment of the mA/kV was utilized to reduce the radiation dose to as low as reasonably achievable. COMPARISON: None available. CLINICAL HISTORY: Neuro deficit, acute, stroke suspected. Sudden onset of left sided tingling. FINDINGS: BRAIN AND VENTRICLES: There is no evidence of an acute infarct, intracranial hemorrhage, mass, midline shift, hydrocephalus, or  extra-axial fluid collection. Cerebral volume is normal. Patchy hypodensities in the cerebral white matter bilaterally are nonspecific but compatible with mild to moderate chronic small vessel ischemic disease. ORBITS: Remote medial left orbital fracture. SINUSES: Mild mucosal thickening in the ethmoid sinuses. Clear mastoid air cells. SOFT TISSUES AND SKULL: No acute soft tissue abnormality. No skull fracture. [place] Stroke Program Early CT Score (ASPECTS) ----- Ganglionic (caudate, IC, lentiform nucleus, insula, M1-M3): 7 Supraganglionic (M4-M6): 3 Total: 10 IMPRESSION:  1. No acute intracranial abnormality. ASPECTS of 10. 2. Mild to moderate chronic small vessel ischemic disease. 3. These results were communicated to Dr. Jerri at 3:29 pm on 11/28/2024 by secure text page via the [facility] messaging system. Electronically signed by: Dasie Hamburg MD 11/28/2024 03:30 PM EST RP Workstation: [id number]       LOS: 0 days   Joette Pebbles  [facility] Pager on [email or web address]  11/29/2024, 10:26 AM   "

## 2024-11-29 NOTE — TOC Transition Note (Signed)
 Transition of Care [facility]) - Discharge Note   Patient Details  Name: TIAGO HUMPHREY MRN: [id number] Date of Birth: 20-Nov-1970  [facility] CM/SW Contact:  Almarie CHRISTELLA Goodie, LCSW Phone Number: 11/29/2024, 3:19 PM   Clinical Narrative:   Patient discharging home with self care, no needs identified at this time.           Patient Goals and CMS Choice            Discharge Placement                       Discharge Plan and Services Additional resources added to the After Visit Summary for                                       Social Drivers of Health (SDOH) Interventions SDOH Screenings   Food Insecurity: No Food Insecurity (11/06/2022)   Received from [facility]  Transportation Needs: No Transportation Needs (11/06/2022)   Received from [facility]  Utilities: Not At Risk (11/06/2022)   Received from [facility]  Financial Resource Strain: Low Risk (11/06/2022)   Received from [facility]  Tobacco Use: High Risk (02/22/2024)   Received from [facility]     Readmission Risk Interventions     No data to display

## 2024-11-29 NOTE — Plan of Care (Signed)
  Problem: Education: Goal: Knowledge of disease or condition will improve Outcome: Progressing   Problem: Safety: Goal: Ability to remain free from injury will improve Outcome: Progressing

## 2024-11-30 LAB — MISC LABCORP TEST (SEND OUT): Labcorp test code: 83935
